# Patient Record
Sex: Female | Born: 1960
Health system: Southern US, Community
[De-identification: ages and names within clinical notes are randomized; demographics above are authoritative.]

## PROBLEM LIST (undated history)

## (undated) DIAGNOSIS — L659 Nonscarring hair loss, unspecified: Secondary | ICD-10-CM

## (undated) DIAGNOSIS — R292 Abnormal reflex: Secondary | ICD-10-CM

## (undated) DIAGNOSIS — R51 Headache: Principal | ICD-10-CM

## (undated) DIAGNOSIS — E669 Obesity, unspecified: Secondary | ICD-10-CM

## (undated) DIAGNOSIS — E559 Vitamin D deficiency, unspecified: Secondary | ICD-10-CM

## (undated) DIAGNOSIS — Z8742 Personal history of other diseases of the female genital tract: Secondary | ICD-10-CM

## (undated) DIAGNOSIS — O139 Gestational [pregnancy-induced] hypertension without significant proteinuria, unspecified trimester: Secondary | ICD-10-CM

## (undated) DIAGNOSIS — G43909 Migraine, unspecified, not intractable, without status migrainosus: Secondary | ICD-10-CM

## (undated) HISTORY — DX: Gestational (pregnancy-induced) hypertension without significant proteinuria, unspecified trimester: O13.9

## (undated) HISTORY — DX: Migraine, unspecified, not intractable, without status migrainosus: G43.909

## (undated) HISTORY — DX: Abnormal reflex: R29.2

## (undated) HISTORY — DX: Obesity, unspecified: E66.9

## (undated) HISTORY — DX: Vitamin D deficiency, unspecified: E55.9

## (undated) HISTORY — DX: Personal history of other diseases of the female genital tract: Z87.42

## (undated) HISTORY — DX: Headache: R51

## (undated) HISTORY — DX: Nonscarring hair loss, unspecified: L65.9

---

## 2002-05-17 ENCOUNTER — Emergency Department (HOSPITAL_COMMUNITY): Admission: EM | Admit: 2002-05-17 | Discharge: 2002-05-17 | Payer: Self-pay | Admitting: Emergency Medicine

## 2002-05-17 ENCOUNTER — Encounter: Payer: Self-pay | Admitting: Emergency Medicine

## 2007-06-29 ENCOUNTER — Encounter: Admission: RE | Admit: 2007-06-29 | Discharge: 2007-06-29 | Payer: Self-pay | Admitting: Family Medicine

## 2009-06-16 ENCOUNTER — Emergency Department (HOSPITAL_COMMUNITY): Admission: EM | Admit: 2009-06-16 | Discharge: 2009-06-16 | Payer: Self-pay | Admitting: Family Medicine

## 2009-06-16 ENCOUNTER — Emergency Department (HOSPITAL_COMMUNITY): Admission: EM | Admit: 2009-06-16 | Discharge: 2009-06-16 | Payer: Self-pay | Admitting: Emergency Medicine

## 2010-01-04 ENCOUNTER — Inpatient Hospital Stay (HOSPITAL_COMMUNITY): Admission: AD | Admit: 2010-01-04 | Discharge: 2010-01-05 | Payer: Self-pay | Admitting: Obstetrics and Gynecology

## 2010-01-04 ENCOUNTER — Ambulatory Visit: Payer: Self-pay | Admitting: Obstetrics and Gynecology

## 2010-02-07 ENCOUNTER — Ambulatory Visit: Payer: Self-pay | Admitting: Obstetrics & Gynecology

## 2010-02-07 ENCOUNTER — Inpatient Hospital Stay (HOSPITAL_COMMUNITY): Admission: AD | Admit: 2010-02-07 | Discharge: 2010-02-08 | Payer: Self-pay | Admitting: Family Medicine

## 2010-02-20 ENCOUNTER — Inpatient Hospital Stay (HOSPITAL_COMMUNITY): Admission: AD | Admit: 2010-02-20 | Discharge: 2010-02-20 | Payer: Self-pay | Admitting: Family Medicine

## 2010-02-20 ENCOUNTER — Ambulatory Visit: Payer: Self-pay | Admitting: Family Medicine

## 2010-02-23 ENCOUNTER — Ambulatory Visit: Payer: Self-pay | Admitting: Advanced Practice Midwife

## 2010-02-23 ENCOUNTER — Inpatient Hospital Stay (HOSPITAL_COMMUNITY): Admission: AD | Admit: 2010-02-23 | Discharge: 2010-02-25 | Payer: Self-pay | Admitting: Obstetrics & Gynecology

## 2010-02-23 ENCOUNTER — Encounter: Payer: Self-pay | Admitting: Obstetrics & Gynecology

## 2010-02-27 ENCOUNTER — Inpatient Hospital Stay (HOSPITAL_COMMUNITY): Admission: AD | Admit: 2010-02-27 | Discharge: 2010-03-03 | Payer: Self-pay | Admitting: Obstetrics & Gynecology

## 2010-03-06 ENCOUNTER — Inpatient Hospital Stay (HOSPITAL_COMMUNITY): Admission: AD | Admit: 2010-03-06 | Discharge: 2010-03-06 | Payer: Self-pay | Admitting: Obstetrics & Gynecology

## 2010-03-31 ENCOUNTER — Ambulatory Visit: Payer: Self-pay | Admitting: Obstetrics and Gynecology

## 2010-03-31 LAB — CONVERTED CEMR LAB: BUN: 13 mg/dL (ref 6–23)

## 2010-04-15 ENCOUNTER — Ambulatory Visit: Payer: Self-pay | Admitting: Obstetrics and Gynecology

## 2010-07-12 LAB — POCT URINALYSIS DIPSTICK
Bilirubin Urine: NEGATIVE
Glucose, UA: NEGATIVE mg/dL
Nitrite: NEGATIVE
pH: 7 (ref 5.0–8.0)

## 2010-07-13 LAB — COMPREHENSIVE METABOLIC PANEL
ALT: 181 U/L — ABNORMAL HIGH (ref 0–35)
AST: 66 U/L — ABNORMAL HIGH (ref 0–37)
CO2: 28 mEq/L (ref 19–32)
Chloride: 103 mEq/L (ref 96–112)
Creatinine, Ser: 0.71 mg/dL (ref 0.4–1.2)
GFR calc Af Amer: >60 mL/min (ref >60)
GFR calc non Af Amer: >60 mL/min (ref >60)
Glucose, Bld: 71 mg/dL (ref 70–99)
Total Bilirubin: 0.6 mg/dL (ref 0.3–1.2)

## 2010-07-14 LAB — URINALYSIS, ROUTINE W REFLEX MICROSCOPIC
Bilirubin Urine: NEGATIVE
Ketones, ur: NEGATIVE mg/dL
Nitrite: NEGATIVE
Specific Gravity, Urine: 1.004 — ABNORMAL LOW (ref 1.005–1.030)
Urobilinogen, UA: 0.2 mg/dL (ref 0.0–1.0)

## 2010-07-14 LAB — COMPREHENSIVE METABOLIC PANEL
ALT: 220 U/L — ABNORMAL HIGH (ref 0–35)
ALT: 299 U/L — ABNORMAL HIGH (ref 0–35)
AST: 102 U/L — ABNORMAL HIGH (ref 0–37)
AST: 262 U/L — ABNORMAL HIGH (ref 0–37)
Alkaline Phosphatase: 186 U/L — ABNORMAL HIGH (ref 39–117)
Alkaline Phosphatase: 175 U/L — ABNORMAL HIGH (ref 39–117)
BUN: 12 mg/dL (ref 6–23)
CO2: 30 mEq/L (ref 19–32)
Calcium: 9 mg/dL (ref 8.4–10.5)
Calcium: 8.7 mg/dL (ref 8.4–10.5)
Chloride: 102 mEq/L (ref 96–112)
Creatinine, Ser: 0.96 mg/dL (ref 0.4–1.2)
GFR calc Af Amer: >60 mL/min (ref >60)
GFR calc Af Amer: >60 mL/min (ref >60)
GFR calc non Af Amer: >60 mL/min (ref >60)
Glucose, Bld: 73 mg/dL (ref 70–99)
Sodium: 138 mEq/L (ref 135–145)
Sodium: 140 mEq/L (ref 135–145)
Total Bilirubin: 0.7 mg/dL (ref 0.3–1.2)
Total Protein: 6.4 g/dL (ref 6.0–8.3)
Total Protein: 6.3 g/dL (ref 6.0–8.3)

## 2010-07-14 LAB — CBC
HCT: 32.9 % — ABNORMAL LOW (ref 36.0–46.0)
Hemoglobin: 11.6 g/dL — ABNORMAL LOW (ref 12.0–15.0)
Hemoglobin: 12.3 g/dL (ref 12.0–15.0)
MCH: 28 pg (ref 26.0–34.0)
MCH: 28.8 pg (ref 26.0–34.0)
MCH: 29 pg (ref 26.0–34.0)
MCHC: 34.3 g/dL (ref 30.0–36.0)
MCHC: 33.5 g/dL (ref 30.0–36.0)
MCV: 81.6 fL (ref 78.0–100.0)
RBC: 4.04 MIL/uL (ref 3.87–5.11)
RBC: 4.24 MIL/uL (ref 3.87–5.11)
RDW: 13.6 % (ref 11.5–15.5)
RDW: 14.2 % (ref 11.5–15.5)

## 2010-07-14 LAB — RPR: RPR Ser Ql: NONREACTIVE

## 2010-07-14 LAB — POCT I-STAT, CHEM 8
BUN: 13 mg/dL (ref 6–23)
Calcium, Ion: 1.21 mmol/L (ref 1.12–1.32)
Chloride: 105 mEq/L (ref 96–112)
Glucose, Bld: 77 mg/dL (ref 70–99)
TCO2: 28 mmol/L (ref 0–100)

## 2010-07-14 LAB — DIFFERENTIAL
Basophils Relative: 1 % (ref 0–1)
Lymphocytes Relative: 24 % (ref 12–46)
Monocytes Relative: 7 % (ref 3–12)
Neutro Abs: 5.6 10*3/uL (ref 1.7–7.7)
Neutrophils Relative %: 66 % (ref 43–77)

## 2010-07-14 LAB — MRSA PCR SCREENING: MRSA by PCR: NEGATIVE

## 2010-07-15 LAB — KLEIHAUER-BETKE STAIN: Fetal Cells %: 0 %

## 2010-07-22 LAB — POCT URINALYSIS DIP (DEVICE)
Bilirubin Urine: NEGATIVE
Glucose, UA: NEGATIVE mg/dL
Ketones, ur: NEGATIVE mg/dL
pH: 5.5 (ref 5.0–8.0)

## 2010-07-22 LAB — BASIC METABOLIC PANEL
BUN: 9 mg/dL (ref 6–23)
Calcium: 9.4 mg/dL (ref 8.4–10.5)
Creatinine, Ser: 0.79 mg/dL (ref 0.4–1.2)
GFR calc non Af Amer: >60 mL/min (ref >60)
Glucose, Bld: 80 mg/dL (ref 70–99)
Sodium: 137 mEq/L (ref 135–145)

## 2010-07-22 LAB — CBC
Hemoglobin: 12.7 g/dL (ref 12.0–15.0)
MCHC: 34 g/dL (ref 30.0–36.0)
Platelets: 234 10*3/uL (ref 150–400)
RDW: 12 % (ref 11.5–15.5)

## 2010-07-22 LAB — DIFFERENTIAL
Basophils Absolute: 0 10*3/uL (ref 0.0–0.1)
Basophils Relative: 1 % (ref 0–1)
Lymphocytes Relative: 48 % — ABNORMAL HIGH (ref 12–46)
Neutro Abs: 1.5 10*3/uL — ABNORMAL LOW (ref 1.7–7.7)
Neutrophils Relative %: 37 % — ABNORMAL LOW (ref 43–77)

## 2010-07-22 LAB — GC/CHLAMYDIA PROBE AMP, GENITAL
Chlamydia, DNA Probe: NEGATIVE
GC Probe Amp, Genital: NEGATIVE

## 2010-07-22 LAB — WET PREP, GENITAL
Clue Cells Wet Prep HPF POC: NONE SEEN
Trich, Wet Prep: NONE SEEN
Yeast Wet Prep HPF POC: NONE SEEN

## 2012-03-11 IMAGING — CT CT ANGIO HEAD
2 of 7 series · 7 of 33 positions shown · IV contrast (APPLIED)
Comparison: None

CLINICAL DATA: Sudden onset severe headache.  5 weeks postpartum.

CT ANGIOGRAPHY HEAD
TECHNIQUE: Multidetector CT imaging of the head was performed
using the standard protocol during bolus administration of
intravenous contrast.  Multiplanar CT image reconstructions
including MIPs were obtained to evaluate the vascular anatomy.
Contrast:  100 ml Omnipaque 315

[Series 3: head w/o 4.8 h47s · axial · non-contrast · 0.47mm/px · z∈[+1174,+1232]mm · 2 of 36 slices shown]
[im 12/36  soft-tissue]
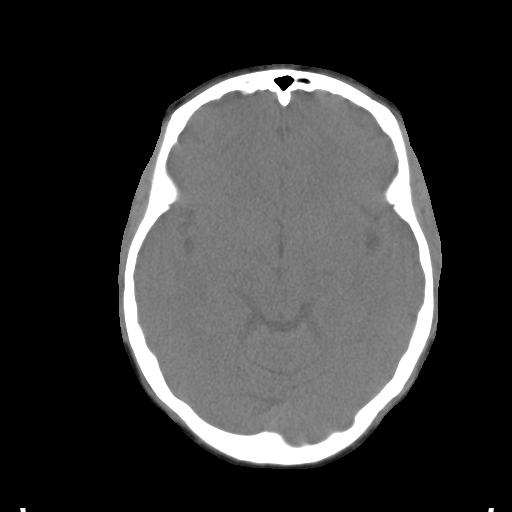
[im 24/36  soft-tissue]
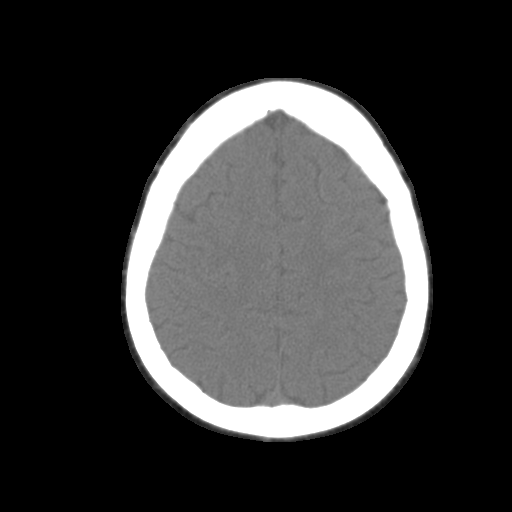

[Series 9: angio 2mm · axial · 0.44mm/px · z∈[+1162,+1270]mm · 5 of 82 slices shown]
[im 14/82  soft-tissue]
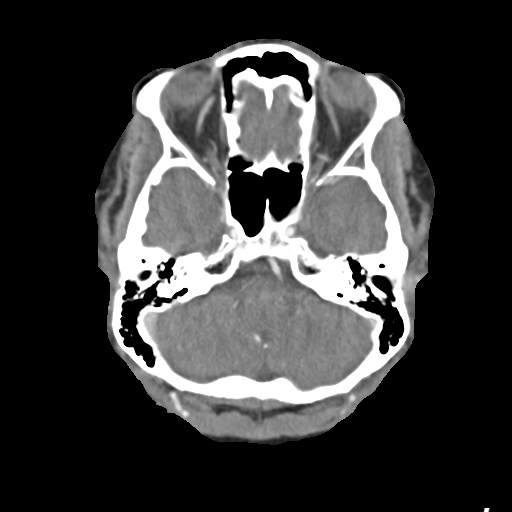
[im 28/82  bone]
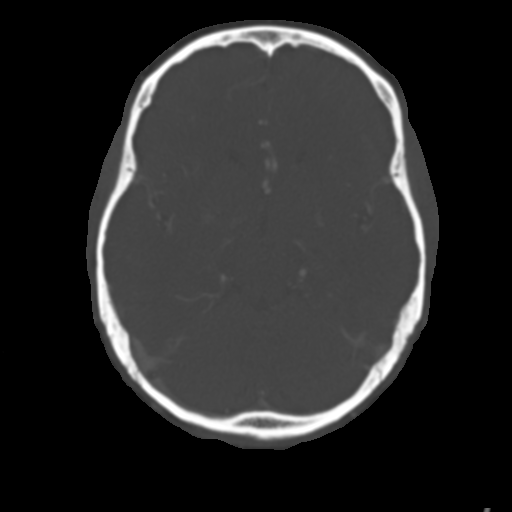
[im 41/82  soft-tissue]
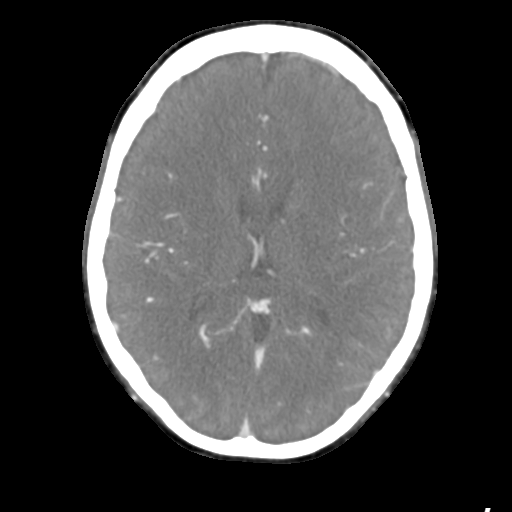
[im 55/82  bone]
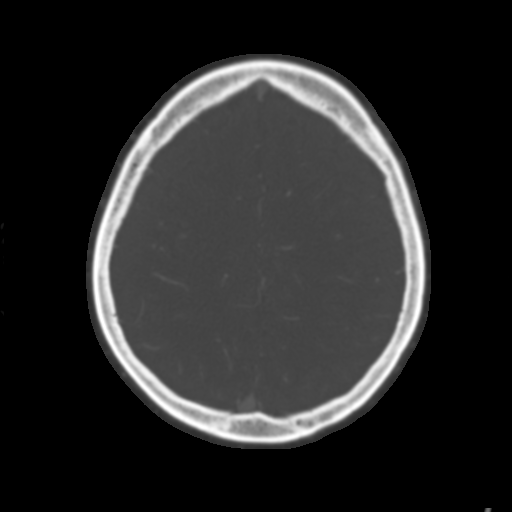
[im 68/82  soft-tissue]
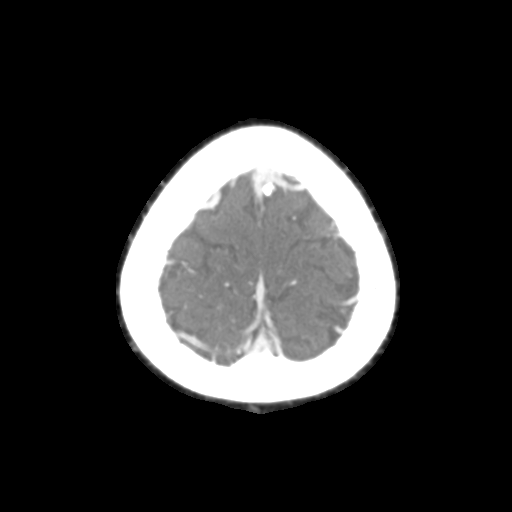

[7 of 33 positions shown; findings below may reference images not displayed]

FINDINGS: Parenchymal imaging shows normal appearance of the
brain.  No evidence of old or acute infarction, mass lesion,
hemorrhage, hydrocephalus or extra-axial collection.  The calvarium
is unremarkable.  Sinuses, middle ears and mastoids are clear.

Both internal carotid arteries are widely patent into the brain.
The anterior and middle cerebral vessels are normal without
proximal stenosis, aneurysm or vascular malformation.  Both
vertebral arteries are patent to the basilar.  No basilar stenosis.
Posterior circulation branch vessels appear normal.

Major venous structures of all appear normal.

 Review of the MIP images confirms the above findings.
IMPRESSION: Normal examination.  No evidence of brain parenchymal pathology.
No evidence of intracranial hemorrhage.  No arterial or venous
pathology evident.

## 2012-05-02 HISTORY — PX: DILATION AND CURETTAGE OF UTERUS: SHX78

## 2012-11-26 ENCOUNTER — Other Ambulatory Visit: Payer: Self-pay | Admitting: Infectious Diseases

## 2012-11-26 ENCOUNTER — Ambulatory Visit
Admission: RE | Admit: 2012-11-26 | Discharge: 2012-11-26 | Disposition: A | Discharge status: Home / Self Care | Payer: No Typology Code available for payment source | Source: Ambulatory Visit | Attending: Infectious Diseases | Admitting: Infectious Diseases

## 2012-11-26 DIAGNOSIS — R7611 Nonspecific reaction to tuberculin skin test without active tuberculosis: Secondary | ICD-10-CM

## 2012-12-26 ENCOUNTER — Other Ambulatory Visit: Payer: Self-pay | Admitting: Obstetrics and Gynecology

## 2013-07-22 ENCOUNTER — Ambulatory Visit (INDEPENDENT_AMBULATORY_CARE_PROVIDER_SITE_OTHER): Payer: BC Managed Care – PPO | Admitting: Neurology

## 2013-07-22 ENCOUNTER — Encounter (INDEPENDENT_AMBULATORY_CARE_PROVIDER_SITE_OTHER): Payer: Self-pay

## 2013-07-22 ENCOUNTER — Encounter: Payer: Self-pay | Admitting: Neurology

## 2013-07-22 VITALS — BP 97/61 | HR 79 | Ht 65.5 in | Wt 175.0 lb

## 2013-07-22 DIAGNOSIS — R519 Headache, unspecified: Secondary | ICD-10-CM | POA: Insufficient documentation

## 2013-07-22 DIAGNOSIS — R51 Headache: Secondary | ICD-10-CM

## 2013-07-22 HISTORY — DX: Headache, unspecified: R51.9

## 2013-07-22 MED ORDER — BUTALBITAL-APAP-CAFFEINE 50-325-40 MG PO TABS: 1.0000 | ORAL_TABLET | Freq: Three times a day (TID) | ORAL | Status: DC | PRN

## 2013-07-22 NOTE — Patient Instructions (Signed)
Please remember, common headache triggers are: sleep deprivation, dehydration, overheating, stress, hypoglycemia or skipping meals and blood sugar fluctuations, excessive pain medications or excessive alcohol use or caffeine withdrawal. Some people have food triggers such as aged cheese, orange juice or chocolate, especially dark chocolate, or MSG (monosodium glutamate). Try to avoid these headache triggers as much possible. It may be helpful to keep a headache diary to figure out what makes your headaches worse or brings them on and what alleviates them. Some people report headache onset after exercise but studies have shown that regular exercise may actually prevent headaches from coming. If you have exercise-induced headaches, please make sure that you drink plenty of fluid before and after exercising and that you do not over do it and do not overheat.  We will try as needed fioricet for your headache. Your headache may be related to the use of phentermine.

## 2013-07-22 NOTE — Progress Notes (Signed)
Subjective:    Patient ID: Kaitlyn Sullivan is a 53 y.o. female.  HPI    Kaitlyn FoleySaima Kahlin Mark, MD, PhD [facility] [address], [address] [address] [place], [place] [address]  Dear Dr. Renne CriglerPharr,   I saw your patient, Kaitlyn Sullivan, upon your kind request in my neurologic clinic today for initial consultation of her headaches, in particular concern for migraines. The patient is unaccompanied today. As you know, Kaitlyn Sullivan is a 53 year old (paper age is [age]) right-handed woman with an underlying medical history of obesity and vitamin D deficiency, who reports recurrent right frontal headaches since November 2014. She has a history of preeclampsia and reports that her headaches have become worse since childbirth. She had a CTA head on 02/27/10 post partum, d/t severe headaches: Normal examination. No evidence of brain parenchymal pathology. No evidence of intracranial hemorrhage. No arterial or venous  pathology evident.  She denies any associated numbness, tingling, weakness, nausea, vomiting, but does have some photophobia. Headache frequency is erratic and it may last several hours. Over-the-counter ibuprofen has helped at a dose of 600 mg. Very occasionally, she has taken hydrocodone, which helped. She describes a R fronto-temporal HA, throbbing and interestingly, she has not had her phentermine for the past 10 days, and in fact, she has not had a HA in the last 8-10. HA frequency was daily or every other day since her last child was born. She has 53 yo twins and a 53 yo and a 53 yo. 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 She endorses stress.  There is no family history of migraine.  Treatments tried include OTC meds and hydrocodone, which she had left from prior dental work and from post partum pain.  The patient denies prior TIA or stroke symptoms, such as sudden onset of one sided weakness, numbness, tingling, slurring of speech or droopy face, hearing loss, tinnitus,  diplopia or visual field cut or monocular loss of vision.  Of note, the patient denies snoring, and there is no report of witnessed apneas or choking sensations while asleep.  Her Past Medical History Is Significant For: Past Medical History  Diagnosis Date  . Obesity, unspecified   . Unspecified vitamin D deficiency   . Transient hypertension of pregnancy, unspecified as to episode of care   . Alopecia, unspecified   . Migraine, unspecified, without mention of intractable migraine without mention of status migrainosus   . Abnormal reflex   . Personal history of other genital system and obstetric disorders(V13.29)     Her Past Surgical History Is Significant For: Past Surgical History  Procedure Laterality Date  . Dilation and curettage of uterus  2014    Her Family History Is Significant For: Family History  Problem Relation Age of Onset  . Hypertension Mother     Her Social History Is Significant For: History   Social History  . Marital Status: Married    Spouse Name: N/A    Number of Children: 4  . Years of Education: college   Occupational History  .     Social History Main Topics  . Smoking status: Never Smoker   . Smokeless tobacco: None  . Alcohol Use: No  . Drug Use: No  . Sexual Activity: None   Other Topics Concern  . None   Social History Narrative   Patient lives at home with her family...and drinks tea.    Her Allergies Are:  Not on File:  Her Current Medications Are:  Outpatient Encounter Prescriptions as of 07/22/2013  Medication Sig  . cholecalciferol (VITAMIN D) 1000 UNITS tablet Take 1,000 Units by mouth daily.  . Multiple Vitamin (MULTIVITAMIN) tablet Take 1 tablet by mouth daily.  . phentermine 37.5 MG capsule Take 37.5 mg by mouth every morning.  :  Review of Systems:  Out of a complete 14 point review of systems, all are reviewed and negative with the exception of these symptoms as listed below:  Review of Systems   Constitutional: Positive for appetite change and unexpected weight change.  Eyes: Positive for visual disturbance.       Blurred vision  Gastrointestinal: Positive for constipation.  Neurological: Positive for weakness, numbness and headaches.  Hematological: Bruises/bleeds easily.       Anemia  Psychiatric/Behavioral: Positive for sleep disturbance.       Decreased Engery    Objective:  Neurologic Exam  Physical Exam Physical Examination:   Filed Vitals:   07/22/13 1310  BP: 97/61  Pulse: 79    General Examination: The patient is a very pleasant 66 female in no acute distress. She appears well-developed and well-nourished and very well groomed. She is overweight.  HEENT: Normocephalic, atraumatic, pupils are equal, round and reactive to light and accommodation. Funduscopic exam is normal with sharp disc margins noted. Extraocular tracking is good without limitation to gaze excursion or nystagmus noted. Normal smooth pursuit is noted. Hearing is grossly intact. Tympanic membranes are clear bilaterally. Face is symmetric with normal facial animation and normal facial sensation. Speech is clear with no dysarthria noted. There is no hypophonia. There is no lip, neck/head, jaw or voice tremor. Neck is supple with full range of passive and active motion. There are no carotid bruits on auscultation. Oropharynx exam reveals: mild mouth dryness, good dental hygiene and mild airway crowding, due to elongated tongue. Mallampati is class II. Tongue protrudes centrally and palate elevates symmetrically. Tonsils are 1+ in size.    Chest: Clear to auscultation without wheezing, rhonchi or crackles noted.  Heart: S1+S2+0, regular and normal without murmurs, rubs or gallops noted.   Abdomen: Soft, non-tender and non-distended with normal bowel sounds appreciated on auscultation.  Extremities: There is no pitting edema in the distal lower extremities bilaterally. Pedal pulses are intact.  Skin:  Warm and dry without trophic changes noted. There are no varicose veins.  Musculoskeletal: exam reveals no obvious joint deformities, tenderness or joint swelling or erythema.   Neurologically:  Mental status: The patient is awake, alert and oriented in all 4 spheres. Her immediate and remote memory, attention, language skills and fund of knowledge are appropriate. There is no evidence of aphasia, agnosia, apraxia or anomia. Speech is clear with normal prosody and enunciation. Thought process is linear. Mood is normal and affect is normal.  Cranial nerves II - XII are as described above under HEENT exam. In addition: shoulder shrug is normal with equal shoulder height noted. Motor exam: Normal bulk, strength and tone is noted. There is no drift, tremor or rebound. Romberg is negative. Reflexes are 2+ throughout. Babinski: Toes are flexor bilaterally. Fine motor skills and coordination: intact with normal finger taps, normal hand movements, normal rapid alternating patting, normal foot taps and normal foot agility.  Cerebellar testing: No dysmetria or intention tremor on finger to nose testing. Heel to shin is unremarkable bilaterally. There is no truncal or gait ataxia.  Sensory exam: intact to light touch, pinprick, vibration, temperature sense in the upper and lower extremities.  Gait, station and balance: She stands easily. No veering to one side is noted. No leaning to one side is noted. Posture is age-appropriate and stance is narrow based. Gait shows normal stride length and normal pace. No problems turning are noted. She turns en bloc. Tandem walk is unremarkable. Intact toe and heel stance is noted.                Assessment and Plan:   In summary, Kaitlyn Sullivan is a very pleasant 53 y.o.-year old female with an underlying medical history of with an underlying medical history of obesity and vitamin D deficiency, who reports recurrent headaches, essentially since her last child was born  some 3 years ago. She has a nonfocal exam. She does not have a classic history of migraines. Her history is suggestive of perhaps tension headache versus medication side effects from the phentermine. It is of interest that she has not taken her phentermine in the last 10 days and she has been essentially headache free for the past 8-10 days. I do not think we need to add any imaging tests at this time. She had a nonfocal CT angiogram some 3-1/2 years ago.  I had a long chat with the patient about my findings and the diagnosis of recurrent headaches, and treatment options. We talked about medical treatments and non-pharmacological approaches. We talked about maintaining a healthy lifestyle in general. I encouraged the patient to eat healthy, exercise daily and keep well hydrated, to keep a scheduled bedtime and wake time routine, to not skip any meals and eat healthy snacks in between meals and to have protein with every meal.   I advised the patient about common headache triggers: sleep deprivation, dehydration, overheating, stress, hypoglycemia or skipping meals and blood sugar fluctuations, excessive pain medications or excessive alcohol use or caffeine withdrawal. Some people have food triggers such as aged cheese, orange juice or chocolate, especially dark chocolate, or MSG (monosodium glutamate). She is to try to avoid these headache triggers as much possible. It may be helpful to keep a headache diary to figure out what makes Her headaches worse or brings them on and what alleviates them. Some people report headache onset after exercise but studies have shown that regular exercise may actually prevent headaches from coming. If She has exercise-induced headaches, She is advised to drink plenty of fluid before and after exercising and that to not overdo it and to not overheat.  As far as medications are concerned, I recommended the following at this time: She can use over-the-counter ibuprofen. For as needed  use a suggest Fioricet. I advised her not to take it daily as it can cause rebound headaches. She does not drink any caffeine to speak of. She is advised that we do not need to pursue a daily medication for headache prevention at this point.  I answered all her questions today and the patient was in agreement with the above outlined plan. I would like to see the patient back in 6 months, sooner if the need arises and encouraged her to call with any interim questions, concerns, problems or updates and refill requests.   Thank you very much for allowing me to participate in the care of this nice patient. If I can be of any further assistance to you please do not hesitate to call me at [phone].  Sincerely,   Kaitlyn Foley, MD, PhD

## 2014-01-23 ENCOUNTER — Ambulatory Visit: Payer: BC Managed Care – PPO | Admitting: Neurology

## 2014-06-05 ENCOUNTER — Ambulatory Visit: Payer: BC Managed Care – PPO | Admitting: Neurology

## 2014-07-21 ENCOUNTER — Emergency Department (HOSPITAL_COMMUNITY)
Admission: EM | Admit: 2014-07-21 | Discharge: 2014-07-21 | Disposition: A | Discharge status: Home / Self Care | Payer: 59 | Attending: Emergency Medicine | Admitting: Emergency Medicine

## 2014-07-21 ENCOUNTER — Encounter (HOSPITAL_COMMUNITY): Payer: Self-pay | Admitting: Emergency Medicine

## 2014-07-21 DIAGNOSIS — M791 Myalgia: Secondary | ICD-10-CM | POA: Insufficient documentation

## 2014-07-21 DIAGNOSIS — G43909 Migraine, unspecified, not intractable, without status migrainosus: Secondary | ICD-10-CM | POA: Diagnosis not present

## 2014-07-21 DIAGNOSIS — Z872 Personal history of diseases of the skin and subcutaneous tissue: Secondary | ICD-10-CM | POA: Diagnosis not present

## 2014-07-21 DIAGNOSIS — E669 Obesity, unspecified: Secondary | ICD-10-CM | POA: Diagnosis not present

## 2014-07-21 DIAGNOSIS — J029 Acute pharyngitis, unspecified: Secondary | ICD-10-CM | POA: Insufficient documentation

## 2014-07-21 DIAGNOSIS — Z79899 Other long term (current) drug therapy: Secondary | ICD-10-CM | POA: Diagnosis not present

## 2014-07-21 DIAGNOSIS — Z8742 Personal history of other diseases of the female genital tract: Secondary | ICD-10-CM | POA: Diagnosis not present

## 2014-07-21 LAB — CBC WITH DIFFERENTIAL/PLATELET
BASOS ABS: 0 10*3/uL (ref 0.0–0.1)
Basophils Relative: 0 % (ref 0–1)
EOS PCT: 1 % (ref 0–5)
Eosinophils Absolute: 0.1 10*3/uL (ref 0.0–0.7)
HCT: 35.1 % — ABNORMAL LOW (ref 36.0–46.0)
Hemoglobin: 12 g/dL (ref 12.0–15.0)
Lymphocytes Relative: 8 % — ABNORMAL LOW (ref 12–46)
Lymphs Abs: 1.1 10*3/uL (ref 0.7–4.0)
MCH: 27.6 pg (ref 26.0–34.0)
MCHC: 34.2 g/dL (ref 30.0–36.0)
MCV: 80.9 fL (ref 78.0–100.0)
MONO ABS: 0.8 10*3/uL (ref 0.1–1.0)
Monocytes Relative: 6 % (ref 3–12)
NEUTROS ABS: 10.9 10*3/uL — AB (ref 1.7–7.7)
NEUTROS PCT: 85 % — AB (ref 43–77)
PLATELETS: 236 10*3/uL (ref 150–400)
RBC: 4.34 MIL/uL (ref 3.87–5.11)
RDW: 12.6 % (ref 11.5–15.5)
WBC: 12.8 10*3/uL — ABNORMAL HIGH (ref 4.0–10.5)

## 2014-07-21 LAB — RAPID STREP SCREEN (MED CTR MEBANE ONLY): STREPTOCOCCUS, GROUP A SCREEN (DIRECT): NEGATIVE

## 2014-07-21 MED ORDER — HYDROCODONE-ACETAMINOPHEN 7.5-325 MG/15ML PO SOLN
10.0000 mL | Freq: Once | ORAL | Status: AC
  Administered 2014-07-21: 10 mL via ORAL
  Filled 2014-07-21: qty 15

## 2014-07-21 MED ORDER — PREDNISONE 20 MG PO TABS
60.0000 mg | ORAL_TABLET | Freq: Once | ORAL | Status: AC
  Administered 2014-07-21: 60 mg via ORAL
  Filled 2014-07-21: qty 3

## 2014-07-21 MED ORDER — HYDROCODONE-ACETAMINOPHEN 5-325 MG PO TABS: 1.0000 | ORAL_TABLET | Freq: Four times a day (QID) | ORAL | Status: DC | PRN

## 2014-07-21 NOTE — ED Notes (Signed)
Pt states she is been having sore throat, fever, difficulty swallowing since Saturday, pt when to her PCP and he started her on Azithromycin, pt states she doesn't have no relief.

## 2014-07-21 NOTE — Discharge Instructions (Signed)
Continue zithromax. Salt water gargles. Take ibuprofen for fever and pain every 6 hrs. Take norco for severe pain. Follow up with primary care doctor or urgent care if not improving or worsening.   Pharyngitis Pharyngitis is redness, pain, and swelling (inflammation) of your pharynx.  CAUSES  Pharyngitis is usually caused by infection. Most of the time, these infections are from viruses (viral) and are part of a cold. However, sometimes pharyngitis is caused by bacteria (bacterial). Pharyngitis can also be caused by allergies. Viral pharyngitis may be spread from person to person by coughing, sneezing, and personal items or utensils (cups, forks, spoons, toothbrushes). Bacterial pharyngitis may be spread from person to person by more intimate contact, such as kissing.  SIGNS AND SYMPTOMS  Symptoms of pharyngitis include:   Sore throat.   Tiredness (fatigue).   Low-grade fever.   Headache.  Joint pain and muscle aches.  Skin rashes.  Swollen lymph nodes.  Plaque-like film on throat or tonsils (often seen with bacterial pharyngitis). DIAGNOSIS  Your health care provider will ask you questions about your illness and your symptoms. Your medical history, along with a physical exam, is often all that is needed to diagnose pharyngitis. Sometimes, a rapid strep test is done. Other lab tests may also be done, depending on the suspected cause.  TREATMENT  Viral pharyngitis will usually get better in 3-4 days without the use of medicine. Bacterial pharyngitis is treated with medicines that kill germs (antibiotics).  HOME CARE INSTRUCTIONS   Drink enough water and fluids to keep your urine clear or pale yellow.   Only take over-the-counter or prescription medicines as directed by your health care provider:   If you are prescribed antibiotics, make sure you finish them even if you start to feel better.   Do not take aspirin.   Get lots of rest.   Gargle with 8 oz of salt water (  tsp of salt per 1 qt of water) as often as every 1-2 hours to soothe your throat.   Throat lozenges (if you are not at risk for choking) or sprays may be used to soothe your throat. SEEK MEDICAL CARE IF:   You have large, tender lumps in your neck.  You have a rash.  You cough up green, yellow-brown, or bloody spit. SEEK IMMEDIATE MEDICAL CARE IF:   Your neck becomes stiff.  You drool or are unable to swallow liquids.  You vomit or are unable to keep medicines or liquids down.  You have severe pain that does not go away with the use of recommended medicines.  You have trouble breathing (not caused by a stuffy nose). MAKE SURE YOU:   Understand these instructions.  Will watch your condition.  Will get help right away if you are not doing well or get worse. Document Released: 04/18/2005 Document Revised: 02/06/2013 Document Reviewed: 12/24/2012 [facility] Patient Information 2015 [employer] [place]. This information is not intended to replace advice given to you by your health care provider. Make sure you discuss any questions you have with your health care provider.

## 2014-07-21 NOTE — ED Provider Notes (Signed)
CSN: [id number]     Arrival date & time 07/21/14  0506 History   First MD Initiated Contact with Patient 07/21/14 [phone]     Chief Complaint  Patient presents with  . Sore Throat  . Headache     (Consider location/radiation/quality/duration/timing/severity/associated sxs/prior Treatment) HPI Kaitlyn Sullivan is a 54 y.o. female with hx of migraines, presents to ED with complaint of sore throat. Pt states sore throat started 2 days ago. Reports taking ibuprofen for it with no improvement. Reports associated headache, fever, body aches. Denies neck pain or stiffness. Patient states she was seen yesterday for the same and started on Z-Pak. She has had 2 tablets yesterday of the medication. She states the pain continued through the night and she was unable to sleep so she came to emergency department. Patient denies difficulty swallowing, states it does hurt to swallow and hurts to speak. States her daughter is sick with similar symptoms. She denies any nasal congestion. Denies any cough. No nausea, vomiting, diarrhea. No urinary symptoms  Past Medical History  Diagnosis Date  . Obesity, unspecified   . Unspecified vitamin D deficiency   . Transient hypertension of pregnancy, unspecified as to episode of care   . Alopecia, unspecified   . Migraine, unspecified, without mention of intractable migraine without mention of status migrainosus   . Abnormal reflex   . Personal history of other genital system and obstetric disorders(V13.29)   . Recurrent headache 07/22/2013   Past Surgical History  Procedure Laterality Date  . Dilation and curettage of uterus  2014   Family History  Problem Relation Age of Onset  . Hypertension Mother    History  Substance Use Topics  . Smoking status: Never Smoker   . Smokeless tobacco: Never Used  . Alcohol Use: No   OB History    No data available     Review of Systems  Constitutional: Positive for fever and chills.  HENT: Positive for sore  throat. Negative for congestion, trouble swallowing and voice change.   Respiratory: Negative for cough.   Musculoskeletal: Positive for myalgias.  Neurological: Positive for headaches.  All other systems reviewed and are negative.     Allergies  Review of patient's allergies indicates not on file.  Home Medications   Prior to Admission medications   Medication Sig Start Date End Date Taking? Authorizing Provider  butalbital-acetaminophen-caffeine (FIORICET, ESGIC) 50-325-40 MG per tablet Take 1 tablet by mouth 3 (three) times daily as needed for headache. 07/22/13   Huston Foley, MD  cholecalciferol (VITAMIN D) 1000 UNITS tablet Take 1,000 Units by mouth daily.    Historical Provider, MD  Multiple Vitamin (MULTIVITAMIN) tablet Take 1 tablet by mouth daily.    Historical Provider, MD  phentermine 37.5 MG capsule Take 37.5 mg by mouth every morning.    Historical Provider, MD   BP 133/78 mmHg  Pulse 103  Temp(Src) 100.5 F (38.1 C) (Oral)  Resp 20  Ht  (1.651 m)  Wt 172 lb (78.019 kg)  BMI 28.62 kg/m2  SpO2 97%  LMP 06/30/2014 Physical Exam  Constitutional: She appears well-developed and well-nourished. No distress.  HENT:  Head: Normocephalic and atraumatic.  Right Ear: Tympanic membrane, external ear and ear canal normal.  Left Ear: Tympanic membrane, external ear and ear canal normal.  Nose: Nose normal.  Mouth/Throat: Uvula is midline and mucous membranes are normal. No uvula swelling. Oropharyngeal exudate and posterior oropharyngeal erythema present. No posterior oropharyngeal edema or tonsillar abscesses.  Tonsils  mildly enlarged bilaterally, exudate present. Tonsils are equal in size. Uvula is midline.  Eyes: Conjunctivae are normal.  Neck: Neck supple.  Cardiovascular: Normal rate, regular rhythm and normal heart sounds.   Pulmonary/Chest: Effort normal and breath sounds normal. No respiratory distress. She has no wheezes. She has no rales.  Abdominal: Soft.  Bowel sounds are normal. She exhibits no distension. There is no tenderness. There is no rebound.  Musculoskeletal: She exhibits no edema.  Neurological: She is alert.  Skin: Skin is warm and dry.  Psychiatric: She has a normal mood and affect. Her behavior is normal.  Nursing note and vitals reviewed.   ED Course  Procedures (including critical care time) Labs Review Labs Reviewed  CBC WITH DIFFERENTIAL/PLATELET - Abnormal; Notable for the following:    WBC 12.8 (*)    HCT 35.1 (*)    Neutrophils Relative % 85 (*)    Neutro Abs 10.9 (*)    Lymphocytes Relative 8 (*)    All other components within normal limits  RAPID STREP SCREEN  CULTURE, GROUP A STREP    Imaging Review No results found.   EKG Interpretation None      MDM   Final diagnoses:  Pharyngitis     Patient is an emergency department with fever and sore throat. Symptoms for 2 days. Exam shows erythematous oropharynx, mildly inflamed bilateral tonsils which are equal in size with uvula midline. No evidence of peritonsillar or retropharyngeal abscess. She is able to swallow fluids in emergency department. There is no trismus. No evidence of Ludwig's angina. She was given prednisone 60 mg by mouth and Lortab elixir for pain. Strep is negative. She is already taking azithromycin, instructed to continue and take until all gone. Most likely viral pharyngitis. Otherwise symptomatic treatment with salt water gargles, ibuprofen, Tylenol. Patient requesting stronger pain medications, stated that's why she's here. We'll give 10 Norco tablets. Instructed to follow-up with her doctor, return if worsening.  Filed Vitals:   07/21/14 0600 07/21/14 0615 07/21/14 0630 07/21/14 0645  BP: 118/71 115/72 117/73 110/71  Pulse: 94 98 89 87  Temp:      TempSrc:      Resp:      Height:      Weight:      SpO2: 97% 98% 98% 97%     Jaynie Crumbleatyana Antonique Langford, PA-C 07/21/14 1545  April Palumbo, MD 07/24/14 0017

## 2014-07-24 LAB — CULTURE, GROUP A STREP: STREP A CULTURE: POSITIVE — AB

## 2014-07-25 ENCOUNTER — Telehealth (HOSPITAL_BASED_OUTPATIENT_CLINIC_OR_DEPARTMENT_OTHER): Payer: Self-pay | Admitting: Emergency Medicine

## 2014-07-25 NOTE — Progress Notes (Signed)
ED Antimicrobial Stewardship Positive Culture Follow Up   Kaitlyn Sullivan is an 54 y.o. female who presented to [facility] on 07/21/2014 with a chief complaint of  Chief Complaint  Patient presents with  . Sore Throat  . Headache    Recent Results (from the past 720 hour(s))  Rapid strep screen     Status: None   Collection Time: 07/21/14  5:38 AM  Result Value Ref Range Status   Streptococcus, Group A Screen (Direct) NEGATIVE NEGATIVE Final    Comment: (NOTE) A Rapid Antigen test may result negative if the antigen level in the sample is below the detection level of this test. The FDA has not cleared this test as a stand-alone test therefore the rapid antigen negative result has reflexed to a Group A Strep culture.   Culture, Group A Strep     Status: Abnormal   Collection Time: 07/21/14  5:38 AM  Result Value Ref Range Status   Strep A Culture Positive (A)  Final    Comment: (NOTE) Penicillin and ampicillin are drugs of choice for treatment of beta-hemolytic streptococcal infections. Susceptibility testing of penicillins and other beta-lactam agents approved by the FDA for treatment of beta-hemolytic streptococcal infections need not be performed routinely because nonsusceptible isolates are extremely rare in any beta-hemolytic streptococcus and have not been reported for Streptococcus pyogenes (group A). (CLSI 2011) Performed At: [facility] [place] [address] [place], [place] [id number] Mila HomerHancock William F MD YN:[phone]     []  Treated with , organism resistant to prescribed antimicrobial [x]  Patient discharged originally without antimicrobial agent and treatment is now indicated Came in with sore throat. Was just started on azithromycin. Pos for strep so going to finish course of azithromycin.    ED Provider: Marlon Peliffany Greene, PA   Ulyses SouthwardMinh Daryn Hicks, PharmD Pager: [phone] Infectious Diseases Pharmacist Phone# [phone]

## 2014-09-03 ENCOUNTER — Other Ambulatory Visit (HOSPITAL_COMMUNITY): Payer: Self-pay | Admitting: Obstetrics and Gynecology

## 2014-09-04 LAB — CYTOLOGY - PAP

## 2017-08-09 ENCOUNTER — Ambulatory Visit (INDEPENDENT_AMBULATORY_CARE_PROVIDER_SITE_OTHER): Payer: Self-pay | Admitting: *Deleted

## 2017-08-09 DIAGNOSIS — I781 Nevus, non-neoplastic: Secondary | ICD-10-CM

## 2017-08-09 NOTE — Progress Notes (Signed)
X=.3% Sotradecol administered with a 27g butterfly.  Patient received a total of 12cc.  Combo of retics and blue spiders. Easy access. Tol well. Hoping for results she is pleased with since she didn't have good results at another office. Follow up in Sept.  Photos: Yes.    Compression stockings applied: Yes.

## 2017-08-10 ENCOUNTER — Encounter: Payer: Self-pay | Admitting: *Deleted

## 2017-08-17 MED FILL — PHENTERMINE 37.5 MG TABLET: 37.5 | 30 days supply | Qty: 30 | Fill #0

## 2017-09-20 MED FILL — PHENTERMINE 37.5 MG TABLET: 37.5 | 30 days supply | Qty: 30 | Fill #0

## 2017-10-23 MED FILL — PHENTERMINE 37.5 MG TABLET: 37.5 | 30 days supply | Qty: 30 | Fill #0

## 2017-10-23 MED FILL — TOPIRAMATE 50 MG TABLET: 50 | 30 days supply | Qty: 60 | Fill #0

## 2017-11-01 MED FILL — XIIDRA 5% EYE DROPS: 5 | 30 days supply | Qty: 60 | Fill #0

## 2017-12-05 MED FILL — TOPIRAMATE 50 MG TABLET: 50 | 30 days supply | Qty: 60 | Fill #0

## 2017-12-11 MED FILL — XIIDRA 5% EYE DROPS: 5 | 30 days supply | Qty: 60 | Fill #1

## 2017-12-11 MED FILL — ALPRAZolam 0.5 MG TABS: 0.5 | 30 days supply | Qty: 30 | Fill #0

## 2017-12-11 MED FILL — PHENTERMINE 37.5 MG TABLET: 37.5 | 30 days supply | Qty: 30 | Fill #0

## 2018-01-16 ENCOUNTER — Ambulatory Visit (INDEPENDENT_AMBULATORY_CARE_PROVIDER_SITE_OTHER): Payer: Self-pay | Admitting: *Deleted

## 2018-01-16 DIAGNOSIS — I781 Nevus, non-neoplastic: Secondary | ICD-10-CM

## 2018-01-16 NOTE — Progress Notes (Signed)
X=.3% Sotradecol administered with a 27g butterfly.  Patient received a total of 12cc.  Having a good result from the prior tx. Still resolving. Cleaned up what remains. Easy access. Tol well. Follow prn.  Photos: Yes.    Compression stockings applied: Yes.

## 2018-01-23 ENCOUNTER — Ambulatory Visit: Payer: Self-pay | Admitting: *Deleted

## 2018-01-23 ENCOUNTER — Ambulatory Visit: Payer: Self-pay

## 2018-01-24 ENCOUNTER — Ambulatory Visit: Payer: Self-pay

## 2018-01-29 MED FILL — PHENTERMINE 37.5 MG TABLET: 37.5 | 30 days supply | Qty: 30 | Fill #0

## 2018-02-12 MED FILL — AMPICILLIN TR 500 MG CAP: 500 | 5 days supply | Qty: 15 | Fill #0

## 2018-02-14 MED FILL — XIIDRA 5% EYE DROPS: 5 | 30 days supply | Qty: 60 | Fill #2

## 2018-03-20 MED FILL — ALPRAZolam 0.5 MG TABS: 0.5 | 30 days supply | Qty: 30 | Fill #0

## 2018-03-20 MED FILL — PHENTERMINE 37.5 MG TABLET: 37.5 | 30 days supply | Qty: 30 | Fill #0

## 2018-05-14 MED FILL — PHENTERMINE 37.5 MG TABLET: 37.5 | 30 days supply | Qty: 30 | Fill #0

## 2018-05-18 MED FILL — ACZONE 5% GEL: 5 | 60 days supply | Qty: 60 | Fill #0

## 2018-10-16 MED FILL — CEFADROXIL 500 MG CAPSULE: 500 | 7 days supply | Qty: 14 | Fill #0

## 2018-10-16 MED FILL — predniSONE 5 MG (21) TBPK: 5 | 6 days supply | Qty: 21 | Fill #0

## 2018-10-16 MED FILL — OXYCODONE-ACETAMINOPHEN 5-3: 5-325 | 3 days supply | Qty: 30 | Fill #0

## 2018-10-23 MED FILL — OXYCODONE-ACETAMINOPHEN 5-3: 5-325 | 3 days supply | Qty: 30 | Fill #0

## 2018-10-29 MED FILL — OXYCODONE-ACETAMINOPHEN 5-3: 5-325 | 3 days supply | Qty: 30 | Fill #0

## 2018-11-07 MED FILL — traMADol HCL 50 MG TABS: 50 | 4 days supply | Qty: 30 | Fill #0

## 2018-11-08 MED FILL — XIIDRA 5% EYE DROPS: 5 | 30 days supply | Qty: 60 | Fill #0

## 2018-12-18 MED FILL — AMOX-CLAV 875-125 MG TABLET: 875-125 | 7 days supply | Qty: 14 | Fill #0

## 2019-05-07 ENCOUNTER — Telehealth: Payer: Self-pay | Admitting: Unknown Physician Specialty

## 2019-05-07 NOTE — Telephone Encounter (Signed)
Discussed with patient about Covid symptoms and the use of bamlanivimab, a monoclonal antibody infusion for those with mild to moderate Covid symptoms and at a high risk of hospitalization.  Pt is not qualified for this infusion as she does not meet the FDA criteria for emergency use of the medication.    Quarantine information given.

## 2019-06-19 MED FILL — ALREX 0.2% EYE DROPS: 0.2 | 25 days supply | Qty: 5 | Fill #0

## 2019-07-24 MED FILL — OXYCODONE-ACETAMINOPHEN 5-3: 5-325 | 5 days supply | Qty: 30 | Fill #0

## 2019-07-24 MED FILL — CEPHALEXIN 500 MG CAPSULE: 500 | 5 days supply | Qty: 20 | Fill #0

## 2019-07-24 MED FILL — predniSONE 5 MG (21) TBPK: 5 | 6 days supply | Qty: 21 | Fill #0

## 2019-10-29 ENCOUNTER — Encounter (HOSPITAL_COMMUNITY): Payer: Self-pay

## 2019-10-29 ENCOUNTER — Emergency Department (HOSPITAL_COMMUNITY)
Admission: EM | Admit: 2019-10-29 | Discharge: 2019-10-30 | Disposition: A | Discharge status: Home / Self Care | Payer: 59 | Attending: Emergency Medicine | Admitting: Emergency Medicine

## 2019-10-29 DIAGNOSIS — R1033 Periumbilical pain: Secondary | ICD-10-CM | POA: Diagnosis present

## 2019-10-29 LAB — URINALYSIS, ROUTINE W REFLEX MICROSCOPIC
Bilirubin Urine: NEGATIVE
Glucose, UA: NEGATIVE mg/dL
Hgb urine dipstick: NEGATIVE
Ketones, ur: NEGATIVE mg/dL
Leukocytes,Ua: NEGATIVE
Nitrite: NEGATIVE
Protein, ur: NEGATIVE mg/dL
Specific Gravity, Urine: 1.01 (ref 1.005–1.030)
pH: 5 (ref 5.0–8.0)

## 2019-10-29 LAB — COMPREHENSIVE METABOLIC PANEL
ALT: 14 U/L (ref 0–44)
AST: 20 U/L (ref 15–41)
Albumin: 3.7 g/dL (ref 3.5–5.0)
Alkaline Phosphatase: 64 U/L (ref 38–126)
Anion gap: 8 (ref 5–15)
BUN: 13 mg/dL (ref 6–20)
CO2: 26 mmol/L (ref 22–32)
Calcium: 9.1 mg/dL (ref 8.9–10.3)
Chloride: 104 mmol/L (ref 98–111)
Creatinine, Ser: 0.99 mg/dL (ref 0.44–1.00)
GFR calc Af Amer: >60 mL/min (ref >60)
GFR calc non Af Amer: >60 mL/min (ref >60)
Glucose, Bld: 105 mg/dL — ABNORMAL HIGH (ref 70–99)
Potassium: 3.6 mmol/L (ref 3.5–5.1)
Sodium: 138 mmol/L (ref 135–145)
Total Bilirubin: 0.5 mg/dL (ref 0.3–1.2)
Total Protein: 7.6 g/dL (ref 6.5–8.1)

## 2019-10-29 LAB — CBC
HCT: 35.2 % — ABNORMAL LOW (ref 36.0–46.0)
Hemoglobin: 11.5 g/dL — ABNORMAL LOW (ref 12.0–15.0)
MCH: 26.8 pg (ref 26.0–34.0)
MCHC: 32.7 g/dL (ref 30.0–36.0)
MCV: 82.1 fL (ref 80.0–100.0)
Platelets: 334 10*3/uL (ref 150–400)
RBC: 4.29 MIL/uL (ref 3.87–5.11)
RDW: 12.2 % (ref 11.5–15.5)
WBC: 4.4 10*3/uL (ref 4.0–10.5)
nRBC: 0 % (ref 0.0–0.2)

## 2019-10-29 LAB — LIPASE, BLOOD: Lipase: 26 U/L (ref 11–51)

## 2019-10-29 LAB — I-STAT BETA HCG BLOOD, ED (MC, WL, AP ONLY): I-stat hCG, quantitative: <5 m[IU]/mL (ref <5)

## 2019-10-29 MED ORDER — SODIUM CHLORIDE 0.9% FLUSH: 3.0000 mL | Freq: Once | INTRAVENOUS | Status: DC

## 2019-10-29 NOTE — ED Triage Notes (Signed)
Pt states that she has been having generalized abd pain for the past 2 days, denies n/v/d. Denies dysuria or abnormal discharge.

## 2019-10-30 ENCOUNTER — Emergency Department (HOSPITAL_COMMUNITY): Payer: 59

## 2019-10-30 MED ORDER — IOHEXOL 300 MG/ML  SOLN
100.0000 mL | Freq: Once | INTRAMUSCULAR | Status: AC | PRN
  Administered 2019-10-30: 100 mL via INTRAVENOUS

## 2019-10-30 MED ORDER — NAPROXEN 500 MG PO TABS: 500.0000 mg | ORAL_TABLET | Freq: Two times a day (BID) | ORAL | 0 refills | Status: DC

## 2019-10-30 NOTE — ED Notes (Signed)
Pt called for VS x2, no response °

## 2019-10-30 NOTE — ED Provider Notes (Signed)
[facility] EMERGENCY DEPARTMENT Provider Note   CSN: [id number] Arrival date & time: 10/29/19  1954     History Chief Complaint  Patient presents with  . Abdominal Pain    Kaitlyn Sullivan is a 59 y.o. female.  HPI     This is a 59 year old female with a history of migraines, obesity who presents with abdominal pain.  Patient reports that she had a tummy tuck 1 year ago.  She had a revision in March.  Since that time she has had some intermittent abdominal soreness.  She states however, over the last 3 days she has had pain that has been more constant.  She states that it is at her bellybutton and sharp in nature.  It is nonradiating.  She has taken ibuprofen and using her belly binder which does seem to help.  She currently is pain-free.  She reports baseline soreness which she has had since her tummy tuck.  She does not follow-up with her surgeon for 1 month.  She denies nausea, vomiting, constipation, diarrhea, fever, urinary symptoms.  Past Medical History:  Diagnosis Date  . Abnormal reflex   . Alopecia, unspecified   . Migraine, unspecified, without mention of intractable migraine without mention of status migrainosus   . Obesity, unspecified   . Personal history of other genital system and obstetric disorders(V13.29)   . Recurrent headache 07/22/2013  . Transient hypertension of pregnancy, unspecified as to episode of care   . Unspecified vitamin D deficiency     Patient Active Problem List   Diagnosis Date Noted  . Recurrent headache 07/22/2013    Past Surgical History:  Procedure Laterality Date  . DILATION AND CURETTAGE OF UTERUS  2014     OB History   No obstetric history on file.     Family History  Problem Relation Age of Onset  . Hypertension Mother     Social History   Tobacco Use  . Smoking status: Never Smoker  . Smokeless tobacco: Never Used  Substance Use Topics  . Alcohol use: No  . Drug use: No    Home  Medications Prior to Admission medications   Medication Sig Start Date End Date Taking? Authorizing Provider  butalbital-acetaminophen-caffeine (FIORICET, ESGIC) 50-325-40 MG per tablet Take 1 tablet by mouth 3 (three) times daily as needed for headache. Patient not taking: Reported on 07/21/2014 07/22/13   Huston Foley, MD  cholecalciferol (VITAMIN D) 1000 UNITS tablet Take 1,000 Units by mouth daily.    [provider]  HYDROcodone-acetaminophen (NORCO) 5-325 MG per tablet Take 1 tablet by mouth every 6 (six) hours as needed for moderate pain. 07/21/14   Kirichenko, Tatyana, PA-C  ibuprofen (ADVIL,MOTRIN) 200 MG tablet Take 600-800 mg by mouth every 6 (six) hours as needed for headache.    [provider]  Multiple Vitamin (MULTIVITAMIN) tablet Take 1 tablet by mouth daily.    [provider]  phentermine 37.5 MG capsule Take 37.5 mg by mouth every morning.    [provider]    Allergies    Patient has no known allergies.  Review of Systems   Review of Systems  Constitutional: Negative for fever.  Respiratory: Negative for shortness of breath.   Cardiovascular: Negative for chest pain.  Gastrointestinal: Positive for abdominal pain. Negative for diarrhea, nausea and vomiting.  Genitourinary: Negative for dysuria.  Musculoskeletal: Negative for back pain.  All other systems reviewed and are negative.   Physical Exam Updated Vital Signs BP  123/86   Pulse 65   Temp 98 F (36.7 C)   Resp 16   LMP 10/22/2019   SpO2 100%   Physical Exam Vitals and nursing note reviewed.  Constitutional:      Appearance: She is well-developed. She is not ill-appearing.  HENT:     Head: Normocephalic and atraumatic.     Mouth/Throat:     Mouth: Mucous membranes are moist.  Eyes:     Pupils: Pupils are equal, round, and reactive to light.  Cardiovascular:     Rate and Rhythm: Normal rate and regular rhythm.     Heart sounds: Normal heart sounds.  Pulmonary:       Effort: Pulmonary effort is normal. No respiratory distress.     Breath sounds: No wheezing.  Abdominal:     General: Bowel sounds are normal.     Palpations: Abdomen is soft.     Tenderness: There is no abdominal tenderness. There is no guarding or rebound.     Comments: Well-healed lower abdominal scar  Musculoskeletal:     Cervical back: Neck supple.  Skin:    General: Skin is warm and dry.  Neurological:     Mental Status: She is alert and oriented to person, place, and time.  Psychiatric:        Mood and Affect: Mood normal.     ED Results / Procedures / Treatments   Labs (all labs ordered are listed, but only abnormal results are displayed) Labs Reviewed  COMPREHENSIVE METABOLIC PANEL - Abnormal; Notable for the following components:      Result Value   Glucose, Bld 105 (*)    All other components within normal limits  CBC - Abnormal; Notable for the following components:   Hemoglobin 11.5 (*)    HCT 35.2 (*)    All other components within normal limits  LIPASE, BLOOD  URINALYSIS, ROUTINE W REFLEX MICROSCOPIC  I-STAT BETA HCG BLOOD, ED ([facility], [facility], [facility] ONLY)    EKG None  Radiology No results found.  Procedures Procedures (including critical care time)  Medications Ordered in ED Medications  sodium chloride flush (NS) 0.9 % injection 3 mL (has no administration in time range)    ED Course  I have reviewed the triage vital signs and the nursing notes.  Pertinent labs & imaging results that were available during my care of the patient were reviewed by me and considered in my medical decision making (see chart for details).    MDM Rules/Calculators/A&P                           Patient presents with abdominal pain.  Overall nontoxic and vital signs are reassuring.  Lab work reviewed from triage.  No leukocytosis.  Normal lipase.  Negative beta-hCG.  No significant metabolic or liver function derangements.  Urinalysis without evidence of UTI.  Patient's  abdominal exam is benign.  She has improvement with NSAIDs and belly binder.  Suggest that this may be muscular and ongoing chronic inflammatory pain related to her tummy tuck.  I discussed with her that I had low suspicion at this time for intra-abdominal pathology such as SBO, appendicitis, cholecystitis.  No obvious urinary source.  Patient is adamant that "something is wrong with me."  I discussed with her that I felt that CT imaging would likely not be contributory.  However, patient states "there has to be something wrong."  She is requesting further evaluation.  She  understands the risk and benefits of CT imaging.  CT obtained.  Patient signed out to oncoming provider.  Final Clinical Impression(s) / ED Diagnoses Final diagnoses:  Periumbilical abdominal pain    Rx / DC Orders ED Discharge Orders    None       Shon Baton, MD 10/30/19 [phone]

## 2019-10-30 NOTE — Discharge Instructions (Addendum)
Follow-up with your surgeon for the abdominal pain.  Also follow-up with your gynecologist for the displaced clip from your fallopian tube. Assume you could get pregnant.

## 2019-11-19 MED FILL — CEPHALEXIN 500 MG CAPSULE: 500 | 7 days supply | Qty: 28 | Fill #0

## 2019-12-18 MED FILL — PHENTERMINE 37.5 MG TABLET: 37.5 | 30 days supply | Qty: 30 | Fill #0

## 2019-12-28 MED FILL — PHENTERMINE 37.5 MG TABLET: 37.5 | 30 days supply | Qty: 30 | Fill #0

## 2020-01-27 MED FILL — ALPRAZolam 0.5 MG TABS: 0.5 | 8 days supply | Qty: 8 | Fill #0

## 2020-02-07 MED FILL — ALPRAZolam 0.5 MG TABS: 0.5 | 8 days supply | Qty: 8 | Fill #0

## 2020-02-10 ENCOUNTER — Other Ambulatory Visit (HOSPITAL_COMMUNITY): Payer: Self-pay | Admitting: Internal Medicine

## 2020-02-10 MED FILL — PHENTERMINE 37.5 MG TABLET: 37.5 | 30 days supply | Qty: 30 | Fill #0

## 2020-03-06 ENCOUNTER — Other Ambulatory Visit (HOSPITAL_COMMUNITY): Payer: Self-pay | Admitting: Internal Medicine

## 2020-03-06 MED FILL — PHENTERMINE 37.5 MG CAPSULE: 37.5 | 30 days supply | Qty: 30 | Fill #0

## 2020-03-18 MED FILL — PHENTERMINE 37.5 MG CAPSULE: 37.5 | 30 days supply | Qty: 30 | Fill #0

## 2020-03-19 ENCOUNTER — Other Ambulatory Visit (HOSPITAL_COMMUNITY): Payer: Self-pay | Admitting: Optometry

## 2020-04-03 MED FILL — XIIDRA 5 % SOLN: 5 | 30 days supply | Qty: 60 | Fill #0

## 2020-05-19 ENCOUNTER — Other Ambulatory Visit (HOSPITAL_COMMUNITY): Payer: Self-pay | Admitting: Optometry

## 2020-05-19 MED FILL — LOTEMAX SM 0.38 % GEL: 0.38 | 25 days supply | Qty: 5 | Fill #0

## 2020-05-29 ENCOUNTER — Other Ambulatory Visit (HOSPITAL_COMMUNITY): Payer: Self-pay | Admitting: Plastic Surgery

## 2020-05-29 MED FILL — CEPHALEXIN 500 MG CAPSULE: 500 | 7 days supply | Qty: 28 | Fill #0

## 2020-06-29 ENCOUNTER — Other Ambulatory Visit (HOSPITAL_COMMUNITY): Payer: Self-pay | Admitting: Plastic Surgery

## 2020-06-29 MED FILL — CEPHALEXIN 500 MG CAPSULE: 500 | 10 days supply | Qty: 40 | Fill #0

## 2020-06-29 MED FILL — PROMETHAZINE 25 MG TABLET: 25 | 5 days supply | Qty: 15 | Fill #0

## 2020-06-29 MED FILL — OXYCODONE-APAP 5-325MG: 5-325 | 7 days supply | Qty: 35 | Fill #0

## 2020-06-29 MED FILL — ALPRAZolam 0.5 MG TABS: 0.5 | 10 days supply | Qty: 30 | Fill #0

## 2020-09-03 ENCOUNTER — Other Ambulatory Visit (HOSPITAL_COMMUNITY): Payer: Self-pay

## 2020-09-03 MED ORDER — PHENTERMINE HCL 37.5 MG PO CAPS
ORAL_CAPSULE | ORAL | 0 refills | Status: DC
  Filled 2020-09-03: qty 30, 30d supply, fill #0

## 2020-09-03 MED ORDER — PHENTERMINE HCL 37.5 MG PO TABS
ORAL_TABLET | ORAL | 0 refills | Status: DC
  Filled 2020-09-03: qty 30, 30d supply, fill #0

## 2021-03-16 ENCOUNTER — Other Ambulatory Visit (HOSPITAL_COMMUNITY): Payer: Self-pay

## 2021-03-16 MED ORDER — XIIDRA 5 % OP SOLN
1.0000 [drp] | Freq: Two times a day (BID) | OPHTHALMIC | 4 refills | Status: AC
  Filled 2021-03-16: qty 180, 90d supply, fill #0

## 2021-04-07 ENCOUNTER — Other Ambulatory Visit (HOSPITAL_COMMUNITY): Payer: Self-pay

## 2021-04-07 MED ORDER — ALPRAZOLAM 0.5 MG PO TABS
0.5000 mg | ORAL_TABLET | Freq: Every day | ORAL | 0 refills | Status: DC | PRN
  Filled 2021-04-07: qty 5, 5d supply, fill #0

## 2021-04-07 MED ORDER — MEFLOQUINE HCL 250 MG PO TABS
250.0000 mg | ORAL_TABLET | ORAL | 0 refills | Status: DC
  Filled 2021-04-07: qty 8, 56d supply, fill #0

## 2021-04-08 ENCOUNTER — Other Ambulatory Visit (HOSPITAL_COMMUNITY): Payer: Self-pay

## 2021-04-09 ENCOUNTER — Other Ambulatory Visit (HOSPITAL_COMMUNITY): Payer: Self-pay

## 2021-05-31 ENCOUNTER — Other Ambulatory Visit (HOSPITAL_COMMUNITY): Payer: Self-pay

## 2021-05-31 MED ORDER — PHENTERMINE HCL 37.5 MG PO TABS
ORAL_TABLET | ORAL | 0 refills | Status: DC
  Filled 2021-05-31 – 2021-06-14 (×2): qty 30, 30d supply, fill #0

## 2021-06-09 ENCOUNTER — Other Ambulatory Visit (HOSPITAL_COMMUNITY): Payer: Self-pay

## 2021-06-14 ENCOUNTER — Other Ambulatory Visit (HOSPITAL_COMMUNITY): Payer: Self-pay

## 2021-06-15 ENCOUNTER — Other Ambulatory Visit (HOSPITAL_COMMUNITY): Payer: Self-pay

## 2021-08-11 ENCOUNTER — Other Ambulatory Visit (HOSPITAL_COMMUNITY): Payer: Self-pay

## 2021-08-11 MED ORDER — TRETINOIN 0.1 % EX CREA
TOPICAL_CREAM | CUTANEOUS | 1 refills | Status: DC
  Filled 2021-08-12: qty 45, 30d supply, fill #0

## 2021-08-11 MED ORDER — WEGOVY 0.25 MG/0.5ML ~~LOC~~ SOAJ
SUBCUTANEOUS | 0 refills | Status: DC
  Filled 2021-08-12 – 2021-08-16 (×3): qty 2, 28d supply, fill #0

## 2021-08-11 MED ORDER — WEGOVY 0.25 MG/0.5ML ~~LOC~~ SOAJ: SUBCUTANEOUS | 0 refills | Status: DC

## 2021-08-11 MED ORDER — DAPSONE 5 % EX GEL
CUTANEOUS | 1 refills | Status: DC
  Filled 2021-08-11 – 2021-10-02 (×4): qty 90, 30d supply, fill #0

## 2021-08-11 MED ORDER — ALPRAZOLAM 0.5 MG PO TABS
ORAL_TABLET | ORAL | 0 refills | Status: DC
  Filled 2021-08-11 – 2021-08-12 (×2): qty 5, 5d supply, fill #0

## 2021-08-12 ENCOUNTER — Other Ambulatory Visit (HOSPITAL_COMMUNITY): Payer: Self-pay

## 2021-08-16 ENCOUNTER — Other Ambulatory Visit (HOSPITAL_COMMUNITY): Payer: Self-pay

## 2021-08-19 ENCOUNTER — Other Ambulatory Visit (HOSPITAL_COMMUNITY): Payer: Self-pay

## 2021-08-25 ENCOUNTER — Other Ambulatory Visit (HOSPITAL_COMMUNITY): Payer: Self-pay

## 2021-08-26 ENCOUNTER — Other Ambulatory Visit (HOSPITAL_COMMUNITY): Payer: Self-pay

## 2021-08-31 ENCOUNTER — Other Ambulatory Visit (HOSPITAL_COMMUNITY): Payer: Self-pay

## 2021-08-31 MED ORDER — OXYCODONE-ACETAMINOPHEN 5-325 MG PO TABS
ORAL_TABLET | ORAL | 0 refills | Status: DC
  Filled 2021-08-31: qty 30, 4d supply, fill #0

## 2021-08-31 MED ORDER — CEPHALEXIN 500 MG PO CAPS
ORAL_CAPSULE | ORAL | 0 refills | Status: DC
  Filled 2021-08-31: qty 20, 5d supply, fill #0

## 2021-09-01 ENCOUNTER — Other Ambulatory Visit (HOSPITAL_COMMUNITY): Payer: Self-pay

## 2021-09-01 MED ORDER — TRAMADOL HCL 50 MG PO TABS
50.0000 mg | ORAL_TABLET | ORAL | 0 refills | Status: DC | PRN
  Filled 2021-09-01: qty 40, 5d supply, fill #0

## 2021-09-03 ENCOUNTER — Other Ambulatory Visit (HOSPITAL_COMMUNITY): Payer: Self-pay

## 2021-09-07 ENCOUNTER — Other Ambulatory Visit (HOSPITAL_COMMUNITY): Payer: Self-pay

## 2021-09-07 MED ORDER — ALPRAZOLAM 1 MG PO TABS
ORAL_TABLET | ORAL | 0 refills | Status: DC
  Filled 2021-09-07: qty 8, 2d supply, fill #0

## 2021-09-07 MED ORDER — TRAMADOL HCL 50 MG PO TABS
ORAL_TABLET | ORAL | 0 refills | Status: DC
  Filled 2021-09-07: qty 40, 10d supply, fill #0

## 2021-09-08 ENCOUNTER — Other Ambulatory Visit (HOSPITAL_COMMUNITY): Payer: Self-pay

## 2021-09-08 MED ORDER — CONTRAVE 8-90 MG PO TB12
ORAL_TABLET | ORAL | 0 refills | Status: DC
  Filled 2021-09-08: qty 70, 28d supply, fill #0

## 2021-09-09 ENCOUNTER — Other Ambulatory Visit (HOSPITAL_COMMUNITY): Payer: Self-pay

## 2021-09-30 ENCOUNTER — Other Ambulatory Visit (HOSPITAL_COMMUNITY): Payer: Self-pay

## 2021-09-30 MED ORDER — HYDROCORTISONE (PERIANAL) 2.5 % EX CREA
TOPICAL_CREAM | CUTANEOUS | 1 refills | Status: DC
  Filled 2021-09-30: qty 30, 15d supply, fill #0

## 2021-09-30 MED ORDER — SENNOSIDES 8.6 MG PO TABS
ORAL_TABLET | ORAL | 1 refills | Status: DC
  Filled 2021-09-30: qty 30, 15d supply, fill #0

## 2021-10-01 ENCOUNTER — Other Ambulatory Visit (HOSPITAL_COMMUNITY): Payer: Self-pay

## 2021-10-02 ENCOUNTER — Other Ambulatory Visit (HOSPITAL_COMMUNITY): Payer: Self-pay

## 2021-10-04 ENCOUNTER — Other Ambulatory Visit (HOSPITAL_COMMUNITY): Payer: Self-pay

## 2021-10-05 ENCOUNTER — Other Ambulatory Visit (HOSPITAL_COMMUNITY): Payer: Self-pay

## 2021-10-13 ENCOUNTER — Other Ambulatory Visit (HOSPITAL_COMMUNITY): Payer: Self-pay

## 2021-11-11 IMAGING — CT CT ABD-PELV W/ CM
2 of 4 series · 17 of 46 positions shown, 19 images · IV contrast (APPLIED)
Comparison: None.

CLINICAL DATA: Acute abdominal pain, periumbilical. Three days
duration.

EXAM:
CT ABDOMEN AND PELVIS WITH CONTRAST
TECHNIQUE: Multidetector CT imaging of the abdomen and pelvis was performed
using the standard protocol following bolus administration of
intravenous contrast.
CONTRAST:  100mL OMNIPAQUE IOHEXOL 300 MG/ML  SOLN

[Series 3: abd/ pelvis 5.0 i30f 2 · axial · 0.93mm/px · z∈[+720,+1145]mm · 14 of 93 slices shown, 16 images]
[im 4/93  soft-tissue]
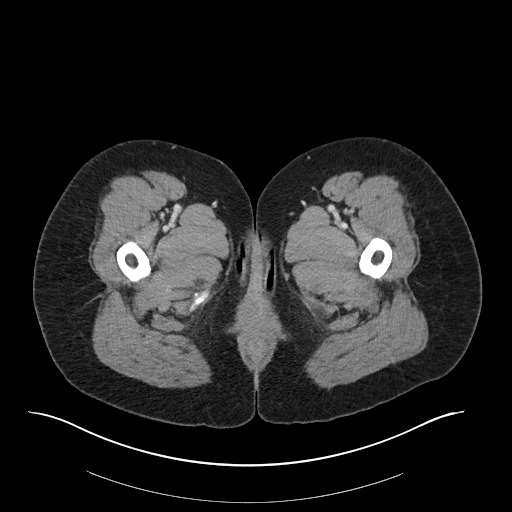
[im 4/93  bone]
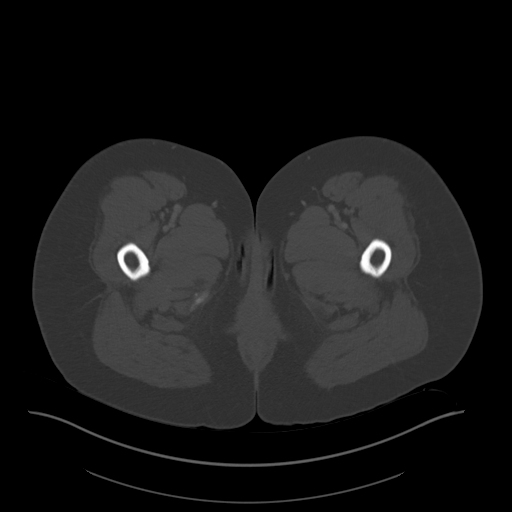
[im 12/93  soft-tissue]
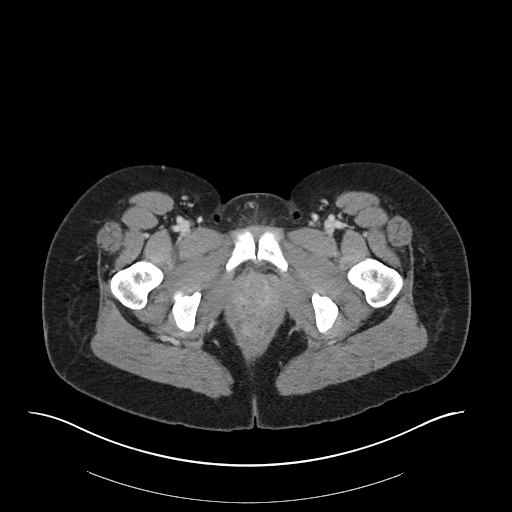
[im 20/93  soft-tissue]
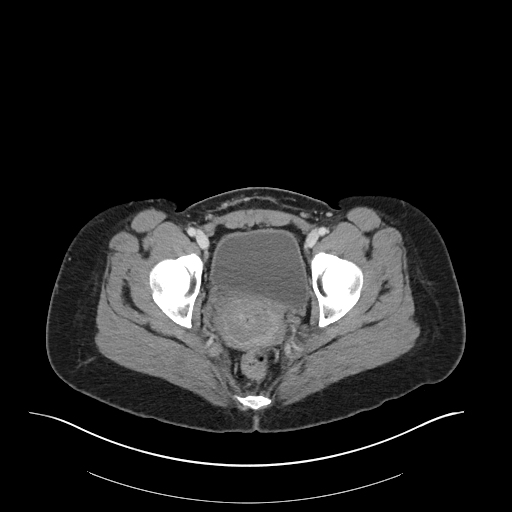
[im 24/93  soft-tissue]
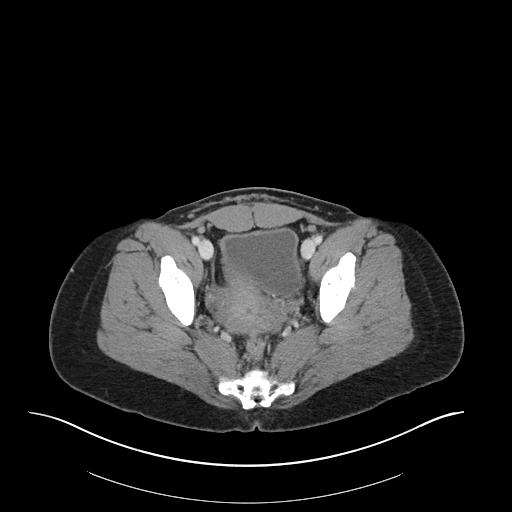
[im 31/93  soft-tissue]
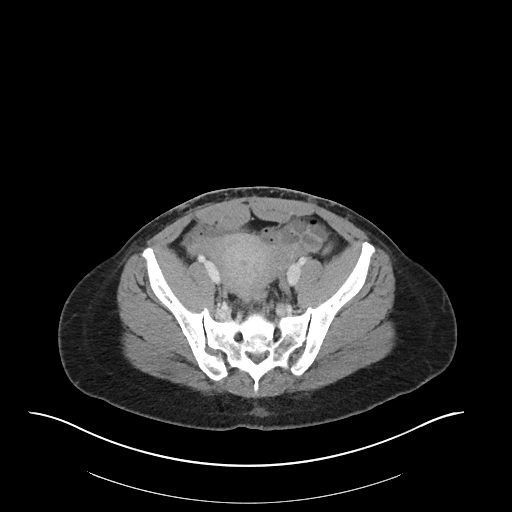
[im 39/93  soft-tissue]
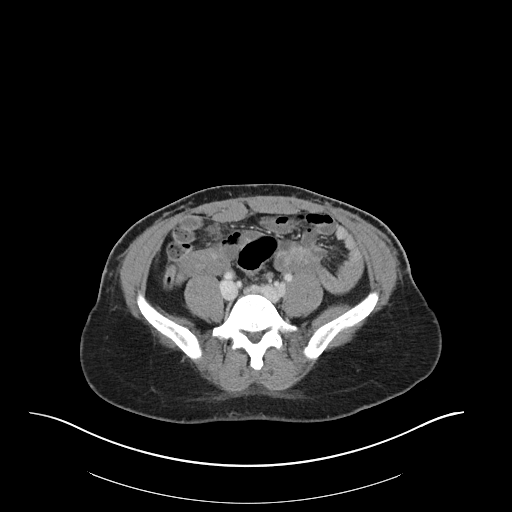
[im 43/93  soft-tissue]
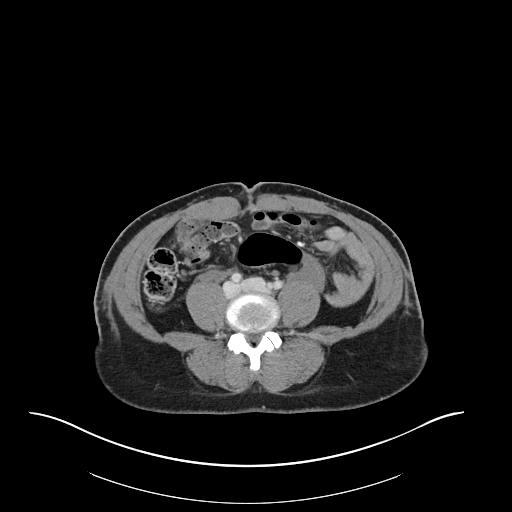
[im 50/93  soft-tissue]
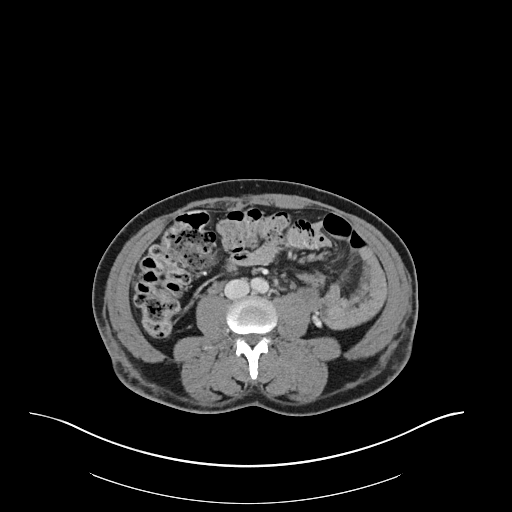
[im 54/93  soft-tissue]
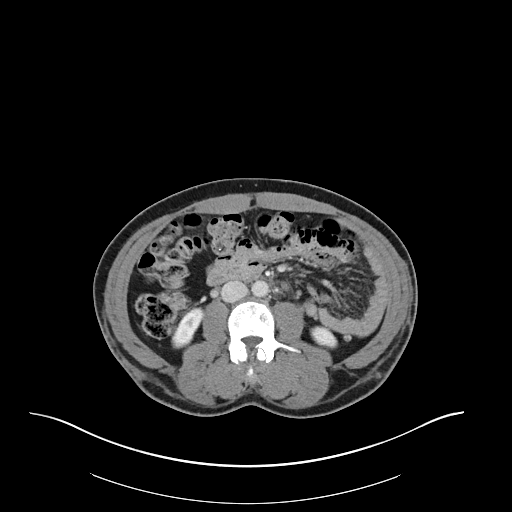
[im 54/93  bone]
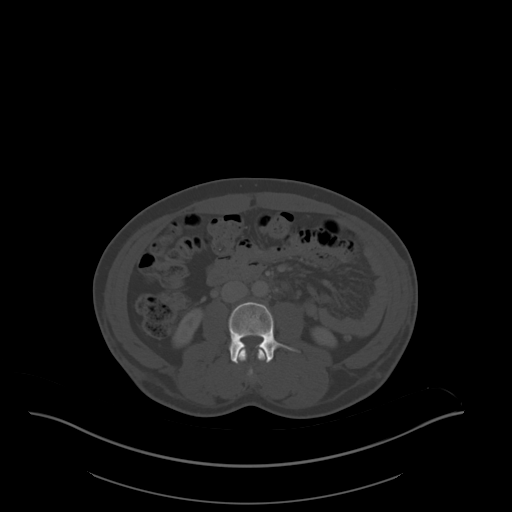
[im 62/93  soft-tissue]
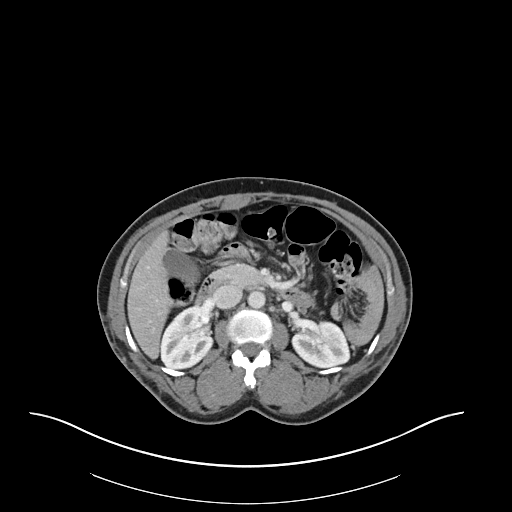
[im 70/93  soft-tissue]
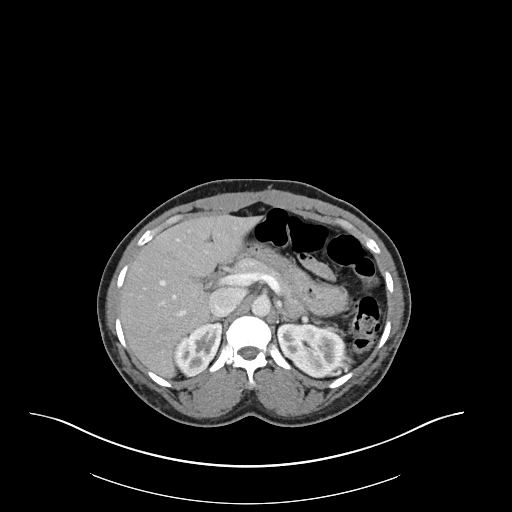
[im 73/93  soft-tissue]
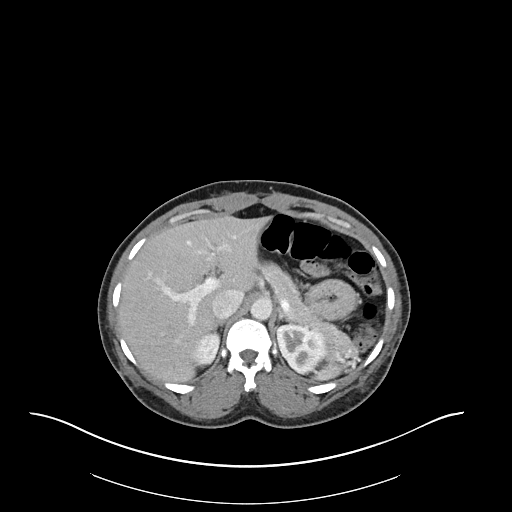
[im 81/93  soft-tissue]
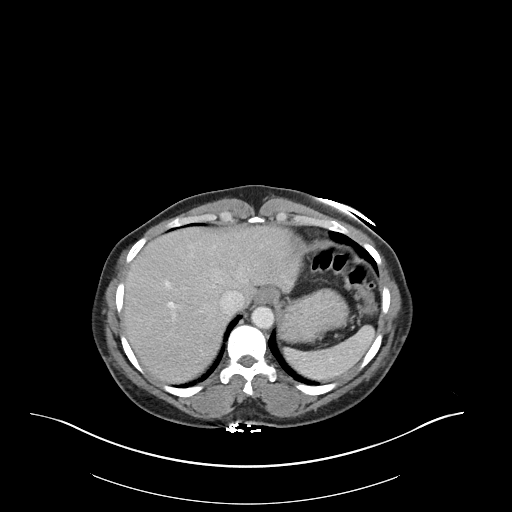
[im 89/93  soft-tissue]
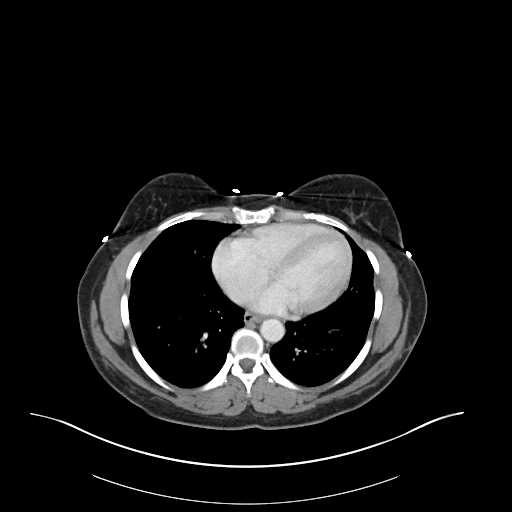

[Series 6: coronal soft tissue · coronal · 0.83mm/px · 3 of 101 slices shown]
[im 34/101  soft-tissue]
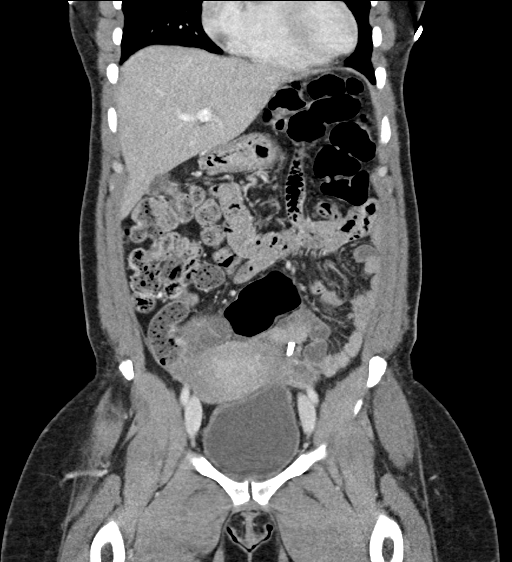
[im 45/101  soft-tissue]
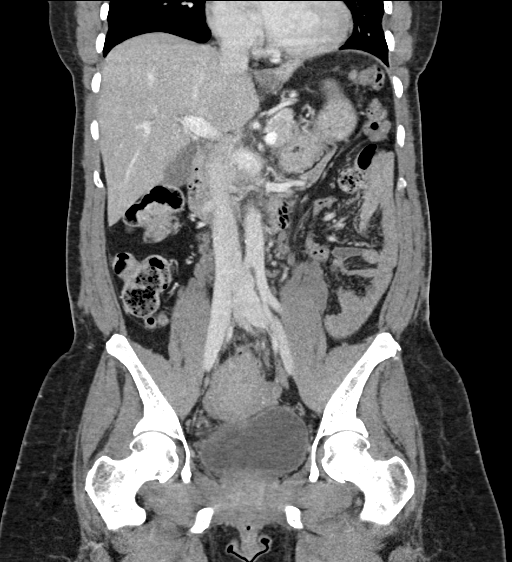
[im 56/101  soft-tissue]
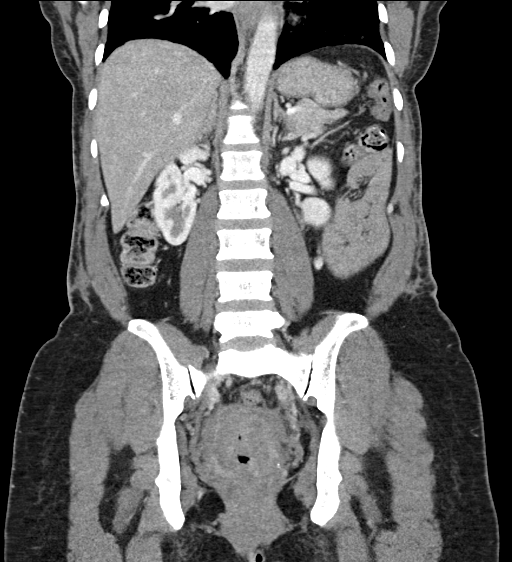

[17 of 46 positions shown; findings below may reference images not displayed]

FINDINGS: Lower chest: Normal

Hepatobiliary: Normal.  No calcified gallstones.

Pancreas: Normal

Spleen: Normal

Adrenals/Urinary Tract: Adrenal glands are normal. Kidneys are
normal. Bladder is normal.

Stomach/Bowel: Stomach and small intestine appear normal. Appendix
is normal. Colon is normal.

Vascular/Lymphatic: Normal.  No adenopathy.

Reproductive: Uterus is normal. No ovarian lesions seen. Surgical
clip in the region of the fallopian tube on the left. Possibly
displaced right fallopian tube clip located within the left central
abdomen.

Other: No free fluid or air.

Musculoskeletal: Ordinary mild lower lumbar degenerative changes.
IMPRESSION: No acute finding seen to explain the clinical presentation. No
evidence of bowel inflammation or obstruction.

Incidental question of a displaced fallopian tube clip, absent from
the right adnexal region and present within the left mid abdomen.
Left fallopian tube clip in an expected location.

## 2022-01-06 ENCOUNTER — Other Ambulatory Visit (HOSPITAL_COMMUNITY): Payer: Self-pay

## 2022-01-06 MED ORDER — METHOCARBAMOL 500 MG PO TABS
500.0000 mg | ORAL_TABLET | Freq: Every day | ORAL | 0 refills | Status: DC
  Filled 2022-01-06: qty 10, 10d supply, fill #0

## 2022-01-06 MED ORDER — IBUPROFEN 800 MG PO TABS
800.0000 mg | ORAL_TABLET | Freq: Three times a day (TID) | ORAL | 0 refills | Status: DC | PRN
  Filled 2022-01-06: qty 20, 7d supply, fill #0

## 2022-01-13 ENCOUNTER — Other Ambulatory Visit (HOSPITAL_COMMUNITY): Payer: Self-pay

## 2022-01-13 MED ORDER — PREDNISONE 10 MG PO TABS
ORAL_TABLET | ORAL | 0 refills | Status: AC
  Filled 2022-01-13: qty 27, 10d supply, fill #0

## 2022-01-13 MED ORDER — DICLOFENAC SODIUM 1 % EX GEL
CUTANEOUS | 0 refills | Status: DC
  Filled 2022-01-13: qty 100, 10d supply, fill #0

## 2022-01-21 ENCOUNTER — Other Ambulatory Visit (HOSPITAL_COMMUNITY): Payer: Self-pay

## 2022-01-21 MED ORDER — CYCLOBENZAPRINE HCL 5 MG PO TABS
5.0000 mg | ORAL_TABLET | Freq: Three times a day (TID) | ORAL | 0 refills | Status: DC
  Filled 2022-01-21: qty 30, 10d supply, fill #0

## 2022-02-08 ENCOUNTER — Ambulatory Visit: Payer: Self-pay | Admitting: Physical Therapy

## 2022-03-09 ENCOUNTER — Other Ambulatory Visit (HOSPITAL_COMMUNITY): Payer: Self-pay

## 2022-03-09 MED ORDER — DAPSONE 5 % EX GEL
CUTANEOUS | 1 refills | Status: DC
  Filled 2022-03-09 – 2022-07-13 (×5): qty 90, 30d supply, fill #0

## 2022-03-09 MED ORDER — PHENTERMINE HCL 37.5 MG PO TABS
37.5000 mg | ORAL_TABLET | Freq: Every day | ORAL | 0 refills | Status: DC
  Filled 2022-03-09: qty 30, 30d supply, fill #0

## 2022-04-05 ENCOUNTER — Other Ambulatory Visit (HOSPITAL_COMMUNITY): Payer: Self-pay

## 2022-04-05 MED ORDER — PHENTERMINE HCL 37.5 MG PO TABS
37.5000 mg | ORAL_TABLET | Freq: Every day | ORAL | 1 refills | Status: DC
  Filled 2022-04-05: qty 30, 30d supply, fill #0

## 2022-04-08 ENCOUNTER — Other Ambulatory Visit (HOSPITAL_COMMUNITY): Payer: Self-pay

## 2022-04-08 MED ORDER — ATOVAQUONE-PROGUANIL HCL 250-100 MG PO TABS
1.0000 | ORAL_TABLET | Freq: Every day | ORAL | 0 refills | Status: DC
  Filled 2022-04-08: qty 40, 40d supply, fill #0

## 2022-04-12 ENCOUNTER — Other Ambulatory Visit (HOSPITAL_COMMUNITY): Payer: Self-pay

## 2022-04-13 ENCOUNTER — Other Ambulatory Visit (HOSPITAL_COMMUNITY): Payer: Self-pay

## 2022-04-14 ENCOUNTER — Other Ambulatory Visit (HOSPITAL_COMMUNITY): Payer: Self-pay

## 2022-04-14 MED ORDER — XIIDRA 5 % OP SOLN: 1.0000 [drp] | Freq: Two times a day (BID) | OPHTHALMIC | 11 refills | Status: DC

## 2022-04-14 MED ORDER — XIIDRA 5 % OP SOLN
1.0000 [drp] | Freq: Two times a day (BID) | OPHTHALMIC | 3 refills | Status: DC
  Filled 2022-04-14 (×2): qty 180, 90d supply, fill #0

## 2022-04-15 ENCOUNTER — Other Ambulatory Visit (HOSPITAL_COMMUNITY): Payer: Self-pay

## 2022-04-15 ENCOUNTER — Other Ambulatory Visit (HOSPITAL_BASED_OUTPATIENT_CLINIC_OR_DEPARTMENT_OTHER): Payer: Self-pay

## 2022-04-15 MED ORDER — CYCLOSPORINE 0.05 % OP EMUL
1.0000 [drp] | Freq: Two times a day (BID) | OPHTHALMIC | 11 refills | Status: AC
  Filled 2022-04-15: qty 60, 30d supply, fill #0

## 2022-06-24 ENCOUNTER — Other Ambulatory Visit (HOSPITAL_COMMUNITY): Payer: Self-pay

## 2022-06-24 MED ORDER — METFORMIN HCL ER 500 MG PO TB24
ORAL_TABLET | ORAL | 0 refills | Status: DC
  Filled 2022-06-24 – 2022-07-04 (×2): qty 180, 90d supply, fill #0

## 2022-06-24 MED ORDER — PHENTERMINE HCL 37.5 MG PO TABS
37.5000 mg | ORAL_TABLET | Freq: Every day | ORAL | 1 refills | Status: DC
  Filled 2022-06-24 – 2022-07-04 (×2): qty 30, 30d supply, fill #0

## 2022-07-01 ENCOUNTER — Other Ambulatory Visit (HOSPITAL_COMMUNITY): Payer: Self-pay

## 2022-07-04 ENCOUNTER — Other Ambulatory Visit (HOSPITAL_COMMUNITY): Payer: Self-pay

## 2022-07-05 ENCOUNTER — Other Ambulatory Visit (HOSPITAL_COMMUNITY): Payer: Self-pay

## 2022-07-11 ENCOUNTER — Other Ambulatory Visit: Payer: Self-pay | Admitting: Obstetrics and Gynecology

## 2022-07-11 DIAGNOSIS — R928 Other abnormal and inconclusive findings on diagnostic imaging of breast: Secondary | ICD-10-CM

## 2022-07-13 ENCOUNTER — Other Ambulatory Visit (HOSPITAL_COMMUNITY): Payer: Self-pay

## 2022-07-13 MED ORDER — FLUCONAZOLE 150 MG PO TABS
ORAL_TABLET | ORAL | 0 refills | Status: DC
  Filled 2022-07-13: qty 2, 3d supply, fill #0

## 2022-07-21 ENCOUNTER — Ambulatory Visit: Payer: Self-pay

## 2022-07-21 ENCOUNTER — Ambulatory Visit
Admission: RE | Admit: 2022-07-21 | Discharge: 2022-07-21 | Disposition: A | Discharge status: Home / Self Care | Payer: Commercial Managed Care - HMO | Source: Ambulatory Visit | Attending: Obstetrics and Gynecology | Admitting: Obstetrics and Gynecology

## 2022-07-21 DIAGNOSIS — R928 Other abnormal and inconclusive findings on diagnostic imaging of breast: Secondary | ICD-10-CM

## 2022-09-19 ENCOUNTER — Encounter (HOSPITAL_COMMUNITY): Payer: Self-pay

## 2022-09-19 ENCOUNTER — Emergency Department (HOSPITAL_COMMUNITY)
Admission: EM | Admit: 2022-09-19 | Discharge: 2022-09-19 | Disposition: A | Discharge status: Home / Self Care | Payer: Commercial Managed Care - HMO | Attending: Emergency Medicine | Admitting: Emergency Medicine

## 2022-09-19 ENCOUNTER — Other Ambulatory Visit: Payer: Self-pay

## 2022-09-19 ENCOUNTER — Emergency Department (HOSPITAL_COMMUNITY): Payer: Commercial Managed Care - HMO

## 2022-09-19 DIAGNOSIS — R519 Headache, unspecified: Secondary | ICD-10-CM | POA: Diagnosis not present

## 2022-09-19 DIAGNOSIS — M436 Torticollis: Secondary | ICD-10-CM | POA: Diagnosis not present

## 2022-09-19 DIAGNOSIS — M542 Cervicalgia: Secondary | ICD-10-CM | POA: Diagnosis present

## 2022-09-19 MED ORDER — HYDROCODONE-ACETAMINOPHEN 5-325 MG PO TABS
1.0000 | ORAL_TABLET | Freq: Once | ORAL | Status: AC
  Administered 2022-09-19: 1 via ORAL
  Filled 2022-09-19: qty 1

## 2022-09-19 MED ORDER — LIDOCAINE 5 % EX PTCH
1.0000 | MEDICATED_PATCH | CUTANEOUS | Status: DC
  Administered 2022-09-19: 1 via TRANSDERMAL
  Filled 2022-09-19: qty 1

## 2022-09-19 MED ORDER — METHOCARBAMOL 500 MG PO TABS: 500.0000 mg | ORAL_TABLET | Freq: Three times a day (TID) | ORAL | 0 refills | Status: DC | PRN

## 2022-09-19 MED ORDER — OXYCODONE HCL 5 MG PO TABS
5.0000 mg | ORAL_TABLET | Freq: Once | ORAL | Status: AC
  Administered 2022-09-19: 5 mg via ORAL
  Filled 2022-09-19: qty 1

## 2022-09-19 MED ORDER — ONDANSETRON 4 MG PO TBDP
4.0000 mg | ORAL_TABLET | Freq: Once | ORAL | Status: AC
  Administered 2022-09-19: 4 mg via ORAL
  Filled 2022-09-19: qty 1

## 2022-09-19 MED ORDER — DIAZEPAM 2 MG PO TABS
2.0000 mg | ORAL_TABLET | Freq: Once | ORAL | Status: AC
  Administered 2022-09-19: 2 mg via ORAL
  Filled 2022-09-19: qty 1

## 2022-09-19 MED ORDER — NAPROXEN 375 MG PO TABS: 375.0000 mg | ORAL_TABLET | Freq: Two times a day (BID) | ORAL | 0 refills | Status: DC

## 2022-09-19 MED ORDER — OXYCODONE HCL 5 MG PO TABS: 2.5000 mg | ORAL_TABLET | ORAL | 0 refills | Status: DC | PRN

## 2022-09-19 NOTE — ED Notes (Signed)
Pt ambulatory at time of discharge. Ambulatory to car. Pt going home with son.

## 2022-09-19 NOTE — ED Triage Notes (Signed)
Pt c/o neck pain that started yesterday; went to bed with neck pain, worsening today; worse when turning head to right; denies known injury, denies headaches, denies light sensitivity, denies fevers

## 2022-09-19 NOTE — ED Notes (Signed)
Pt is a&ox4, warm and dry to touch. Pt is complaining of right sided neck pain that began yesterday at approx noon. Pt denies any injury, numbness or tingling in any extremities.

## 2022-09-19 NOTE — Discharge Instructions (Addendum)
Get help right away if: You have trouble breathing. You make loud, high-pitched sounds when you breathe, most often when you breathe in (stridor). You start to drool. You have trouble swallowing or pain when swallowing. You develop numbness or weakness in your hands or feet. You have changes in your speech, understanding, or vision. You are in severe pain. You cannot move your head or neck. These symptoms may represent a serious problem that is an emergency. Do not wait to see if the symptoms will go away. Get medical help right away. Call your local emergency services (911 in the [place] Do not drive yourself to the hospital.

## 2022-09-19 NOTE — ED Provider Triage Note (Signed)
Emergency Medicine Provider Triage Evaluation Note  Kaitlyn Sullivan , a 62 y.o. female  was evaluated in triage.  Patient presenting with neck pain.  Has been going on for the past 2 days.  Unable to rotate her neck.  No fevers or chills.  No photophobia.  No headache.  Has tried Flexeril and NSAIDs without relief  Review of Systems  Positive:  Negative:  Physical Exam  BP (!) 152/99   Pulse 95   Temp 99.2 F (37.3 C) (Oral)   Resp 18   Ht 5\' 5"  (1.651 m)   Wt 81.6 kg   SpO2 100%   BMI 29.95 kg/m  Gen:   Awake, no distress   Resp:  Normal effort  MSK:   Moves extremities without difficulty  Other:  Reproducible tenderness along the right trapezius.  No tenderness on the left side.  Limited range of motion with rotation and lateral flexion bilaterally Medical Decision Making  Medically screening exam initiated at 2:38 PM.  Appropriate orders placed.  Kaitlyn Sullivan was informed that the remainder of the evaluation will be completed by another provider, this initial triage assessment does not replace that evaluation, and the importance of remaining in the ED until their evaluation is complete.     Saddie Benders, PA-C 09/19/22 1439

## 2022-09-19 NOTE — ED Notes (Signed)
Discharge instructions provided by edp were reinforced with pt. Pt verbalized understanding with no additional questions at this time. Pt called ride d/t medications given, see mar.

## 2022-09-19 NOTE — ED Notes (Signed)
Pt informed her daughter called requesting an update from her. Pt states she will call her daughter. Informed pt she is currently waiting on CT scans.

## 2022-09-19 NOTE — ED Notes (Signed)
Pt requesting to go to CT now. Informed department is having a lot of high acuity traumas that need to be scanned first. That she is on the list and they will get her shortly.

## 2022-09-19 NOTE — ED Notes (Signed)
Patient transported to CT 

## 2022-09-19 NOTE — ED Provider Notes (Signed)
Kait[facility] EMERGENCY DEPARTMENT AT [facility] Provider Note   CSN: [id number] Arrival date & time: 09/19/22  1349     History  Chief Complaint  Patient presents with   Neck Pain    Kaitlyn Sullivan is a 62 y.o. female.who presents with a chief complaint of severe right-sided neck pain.  Patient reports that she was out of town and returned home from travel yesterday.  She was in her normal state of health, fell asleep and then woke up with severe right-sided neck pain radiating into the scalp.  She has pain whenever she tries to change position move or turn her neck she denies vision change, vertigo or ataxia.  She denies any known injuries.  She describes the pain as aching and severe.   Neck Pain      Home Medications Prior to Admission medications   Medication Sig Start Date End Date Taking? Authorizing Provider  methocarbamol (ROBAXIN) 500 MG tablet Take 1 tablet (500 mg total) by mouth 3 (three) times daily as needed for muscle spasms. 09/19/22  Yes Shantanique Hodo, PA-C  naproxen (NAPROSYN) 375 MG tablet Take 1 tablet (375 mg total) by mouth 2 (two) times daily with a meal. 09/19/22  Yes Kei Langhorst, PA-C  oxyCODONE (ROXICODONE) 5 MG immediate release tablet Take 0.5-1 tablets (2.5-5 mg total) by mouth every 4 (four) hours as needed for severe pain. 09/19/22  Yes Hallie Ishida, PA-C  ALPRAZolam Prudy Feeler) 0.5 MG tablet Take 1 tablet by mouth daily as needed. 04/07/21     ALPRAZolam (XANAX) 0.5 MG tablet Take 1 tablet by mouth every day as needed. 08/11/21     ALPRAZolam (XANAX) 1 MG tablet Take 1 tablet every 6 to 8 hours as needed. 09/06/21   Contogiannis, Chales Abrahams, MD  atovaquone-proguanil (MALARONE) 250-100 MG TABS tablet Take 1 tablet by mouth daily with meals 04/08/22     butalbital-acetaminophen-caffeine (FIORICET, ESGIC) 50-325-40 MG per tablet Take 1 tablet by mouth 3 (three) times daily as needed for headache. Patient not taking: Reported on 07/21/2014  07/22/13   Huston Foley, MD  cephALEXin (KEFLEX) 500 MG capsule Take 1 capsule by mouth 4 times a day 08/30/21   Contogiannis, Chales Abrahams, MD  cholecalciferol (VITAMIN D) 1000 UNITS tablet Take 1,000 Units by mouth daily.    [provider]  cyclobenzaprine (FLEXERIL) 5 MG tablet Take 1 tablet (5 mg total) by mouth 3 (three) times daily. 01/21/22     cycloSPORINE (RESTASIS) 0.05 % ophthalmic emulsion Instill 1 drop into both eyes twice a day 04/15/22     Dapsone (ACZONE) 5 % topical gel Apply to skin twice a day 08/11/21     Dapsone (ACZONE) 5 % topical gel Apply topically twice a day. 03/08/22     diclofenac Sodium (VOLTAREN) 1 % GEL Apply to affected area 2 times a day as needed 01/13/22     fluconazole (DIFLUCAN) 150 MG tablet Take 1 tablet by mouth now then repeat in 3 days if needed 07/13/22     hydrocortisone (ANUSOL-HC) 2.5 % rectal cream APPLY A THIN LAYER TO THE AFFECTED AREA(S) OF SKIN 2-4 TIMES DAILY 09/30/21     Lifitegrast (XIIDRA) 5 % SOLN Instill 1 drop into both eyes twice a day 03/16/21     Lifitegrast (XIIDRA) 5 % SOLN Place 1 drop into both eyes 2 (two) times daily. 04/14/22     Lifitegrast (XIIDRA) 5 % SOLN Place 1 drop into both eyes 2 (two) times daily. 04/14/22  mefloquine (LARIAM) 250 MG tablet Take 1 tablet by mouth once a week. 04/07/21     metFORMIN (GLUCOPHAGE-XR) 500 MG 24 hr tablet Take 1 tablet by mouth twice a day for weight loss. 06/24/22     Multiple Vitamin (MULTIVITAMIN) tablet Take 1 tablet by mouth daily.    [provider]  Naltrexone-buPROPion HCl ER (CONTRAVE) 8-90 MG TB12 Take 1 tablet by mouth daily x7 days--1 tablet twice daily x7 days-- 2 tablets in AM and 1 tablet in PM x7 days-- 2 tablets twice daily x7 days 09/07/21     phentermine (ADIPEX-P) 37.5 MG tablet TAKE 1 TABLET BY MOUTH DAILY 02/10/20 08/08/20  Jamison Oka B, MD  phentermine (ADIPEX-P) 37.5 MG tablet Take 1 Tablet by mouth once daily 09/03/20     phentermine (ADIPEX-P) 37.5 MG tablet  Take 1 tablet (37.5 mg total) by mouth daily. 04/05/22     phentermine (ADIPEX-P) 37.5 MG tablet Take 1 tablet (37.5 mg total) by mouth daily. 06/24/22     phentermine 37.5 MG capsule Take 37.5 mg by mouth every morning.    [provider]  phentermine 37.5 MG capsule TAKE 1 CAPSULE BY MOUTH ONCE A DAY FOR 30 DAYS 03/06/20 09/02/20  Jamison Oka B, MD  phentermine 37.5 MG capsule Take 1 capsule by mouth once daily 09/03/20     promethazine (PHENERGAN) 25 MG tablet TAKE 1 TABLET BY MOUTH EVERY 8 HOURS AS NEEDED 06/29/20 06/29/21  Merri Brunette, MD  Semaglutide-Weight Management (WEGOVY) 0.25 MG/0.5ML SOAJ Inject 1 pen (0.25 mg)  into the skin every week by subcutaneous route. 08/11/21     Semaglutide-Weight Management (WEGOVY) 0.25 MG/0.5ML SOAJ Inject 1 pen (0.25 mg) into the skin every week 08/11/21     senna (SENOKOT) 8.6 MG tablet Take 2 tablets by mouth at bedtime *May take 1 tablet when stool is loose* 09/30/21     traMADol (ULTRAM) 50 MG tablet Take 1-2 tablets (50-100 mg total) by mouth every 4-6 hours as needed for pain 09/01/21   Contogiannis, Chales Abrahams, MD  traMADol (ULTRAM) 50 MG tablet Take 1 to 2 tablets by mouth every 4 to 6 hours as needed for pain. 09/06/21   Contogiannis, Chales Abrahams, MD  tretinoin (RETIN-A) 0.1 % cream APPLY topically 20 to 30 minutes after washing and drying face at night 08/11/21         Allergies    Patient has no known allergies.    Review of Systems   Review of Systems  Musculoskeletal:  Positive for neck pain.    Physical Exam Updated Vital Signs BP (!) 156/96 (BP Location: Left Arm)   Pulse 77   Temp 98 F (36.7 C) (Axillary)   Resp 20   Ht 5\' 5"  (1.651 m)   Wt 81.6 kg   SpO2 96%   BMI 29.95 kg/m  Physical Exam Vitals and nursing note reviewed.  Constitutional:      General: She is not in acute distress.    Appearance: She is well-developed. She is not diaphoretic.  HENT:     Head: Normocephalic and atraumatic.     Right Ear: External ear  normal.     Left Ear: External ear normal.     Nose: Nose normal.     Mouth/Throat:     Mouth: Mucous membranes are moist.  Eyes:     General: No scleral icterus.    Conjunctiva/sclera: Conjunctivae normal.  Neck:   Cardiovascular:     Rate and Rhythm:  Normal rate and regular rhythm.     Heart sounds: Normal heart sounds. No murmur heard.    No friction rub. No gallop.  Pulmonary:     Effort: Pulmonary effort is normal. No respiratory distress.     Breath sounds: Normal breath sounds.  Abdominal:     General: Bowel sounds are normal. There is no distension.     Palpations: Abdomen is soft. There is no mass.     Tenderness: There is no abdominal tenderness. There is no guarding.  Musculoskeletal:     Cervical back: Torticollis present. Muscular tenderness present. No spinous process tenderness. Decreased range of motion.  Skin:    General: Skin is warm and dry.  Neurological:     Mental Status: She is alert and oriented to person, place, and time.     Cranial Nerves: No cranial nerve deficit.     Sensory: No sensory deficit.     Motor: No weakness.     Coordination: Coordination normal.  Psychiatric:        Behavior: Behavior normal.     ED Results / Procedures / Treatments   Labs (all labs ordered are listed, but only abnormal results are displayed) Labs Reviewed - No data to display  EKG None  Radiology CT Cervical Spine Wo Contrast  Result Date: 09/19/2022 CLINICAL DATA:  Head and neck pain. EXAM: CT HEAD WITHOUT CONTRAST CT CERVICAL SPINE WITHOUT CONTRAST TECHNIQUE: Multidetector CT imaging of the head and cervical spine was performed following the standard protocol without intravenous contrast. Multiplanar CT image reconstructions of the cervical spine were also generated. RADIATION DOSE REDUCTION: This exam was performed according to the departmental dose-optimization program which includes automated exposure control, adjustment of the mA and/or kV according to  patient size and/or use of iterative reconstruction technique. COMPARISON:  CT examination dated Sep 02, 2021 FINDINGS: CT HEAD FINDINGS Brain: No evidence of acute infarction, hemorrhage, hydrocephalus, extra-axial collection or mass lesion/mass effect. Vascular: No hyperdense vessel or unexpected calcification. Skull: Normal. Negative for fracture or focal lesion. Sinuses/Orbits: No acute finding. Other: None. CT CERVICAL SPINE FINDINGS Alignment: Straightening of the cervical spine. Skull base and vertebrae: No acute fracture. No primary bone lesion or focal pathologic process. Soft tissues and spinal canal: No prevertebral fluid or swelling. No visible canal hematoma. Disc levels: Disc height loss and osteophytes at C3-C4. Mild uncovertebral joint arthropathy. Mild neural foraminal stenosis. Upper chest: Negative. Other: None IMPRESSION: CT HEAD: No acute intracranial abnormality. CT CERVICAL SPINE: 1. No acute fracture or subluxation. 2. Mild degenerative disc disease at C3-C4. Electronically Signed   By: Larose Hires D.O.   On: 09/19/2022 22:05   CT Head Wo Contrast  Result Date: 09/19/2022 CLINICAL DATA:  Head and neck pain. EXAM: CT HEAD WITHOUT CONTRAST CT CERVICAL SPINE WITHOUT CONTRAST TECHNIQUE: Multidetector CT imaging of the head and cervical spine was performed following the standard protocol without intravenous contrast. Multiplanar CT image reconstructions of the cervical spine were also generated. RADIATION DOSE REDUCTION: This exam was performed according to the departmental dose-optimization program which includes automated exposure control, adjustment of the mA and/or kV according to patient size and/or use of iterative reconstruction technique. COMPARISON:  CT examination dated Sep 02, 2021 FINDINGS: CT HEAD FINDINGS Brain: No evidence of acute infarction, hemorrhage, hydrocephalus, extra-axial collection or mass lesion/mass effect. Vascular: No hyperdense vessel or unexpected calcification.  Skull: Normal. Negative for fracture or focal lesion. Sinuses/Orbits: No acute finding. Other: None. CT CERVICAL SPINE FINDINGS Alignment: Straightening  of the cervical spine. Skull base and vertebrae: No acute fracture. No primary bone lesion or focal pathologic process. Soft tissues and spinal canal: No prevertebral fluid or swelling. No visible canal hematoma. Disc levels: Disc height loss and osteophytes at C3-C4. Mild uncovertebral joint arthropathy. Mild neural foraminal stenosis. Upper chest: Negative. Other: None IMPRESSION: CT HEAD: No acute intracranial abnormality. CT CERVICAL SPINE: 1. No acute fracture or subluxation. 2. Mild degenerative disc disease at C3-C4. Electronically Signed   By: Larose Hires D.O.   On: 09/19/2022 22:05    Procedures Procedures    Medications Ordered in ED Medications  lidocaine (LIDODERM) 5 % 1 patch (1 patch Transdermal Patch Applied 09/19/22 1943)  oxyCODONE (Oxy IR/ROXICODONE) immediate release tablet 5 mg (has no administration in time range)  HYDROcodone-acetaminophen (NORCO/VICODIN) 5-325 MG per tablet 1 tablet (1 tablet Oral Given 09/19/22 1942)  ondansetron (ZOFRAN-ODT) disintegrating tablet 4 mg (4 mg Oral Given 09/19/22 1942)  diazepam (VALIUM) tablet 2 mg (2 mg Oral Given 09/19/22 2011)    ED Course/ Medical Decision Making/ A&P Clinical Course as of 09/19/22 2252  Mon Sep 19, 2022  2216 CT Cervical Spine Wo Contrast [AH]  2217 CT Head Wo Contrast [AH]    Clinical Course User Index [AH] Arthor Captain, PA-C                             Medical Decision Making Amount and/or Complexity of Data Reviewed Radiology: ordered and independent interpretation performed. Decision-making details documented in ED Course.  Risk Prescription drug management.   62 year old female with acute neck pain after waking up.  No injuries noted.  I have considered in my differential occipital neuralgia, trigeminal neuralgia, vertebral artery dissection.  I have  low suspicion for all of these.  She does not have any obvious rash which would make me think that she is having a zoster outbreak.  Patient has palpable, tender spasm of the right side of the neck with difficulty moving the neck due to pain.  She was given Valium and Norco with significant improvement in her pain.  I ordered imaging and interpreted these images including CT head and C-spine which showed some mild degenerative changes without acute finding.  Patient will be discharged with pain medication, anti-inflammatories, muscle relaxer and outpatient follow-up.  Discussed return precautions.        Final Clinical Impression(s) / ED Diagnoses Final diagnoses:  Acute torticollis    Rx / DC Orders ED Discharge Orders          Ordered    oxyCODONE (ROXICODONE) 5 MG immediate release tablet  Every 4 hours PRN        09/19/22 2252    naproxen (NAPROSYN) 375 MG tablet  2 times daily with meals        09/19/22 2252    methocarbamol (ROBAXIN) 500 MG tablet  3 times daily PRN        09/19/22 2252              Arthor Captain, PA-C 09/19/22 2252    Wynetta Fines, MD 09/19/22 [phone]

## 2022-09-21 ENCOUNTER — Other Ambulatory Visit (HOSPITAL_COMMUNITY): Payer: Self-pay

## 2022-09-21 MED ORDER — IBUPROFEN 600 MG PO TABS
600.0000 mg | ORAL_TABLET | Freq: Three times a day (TID) | ORAL | 0 refills | Status: DC
  Filled 2022-09-21: qty 15, 5d supply, fill #0

## 2022-09-30 ENCOUNTER — Emergency Department (HOSPITAL_COMMUNITY): Payer: Commercial Managed Care - HMO

## 2022-09-30 ENCOUNTER — Other Ambulatory Visit: Payer: Self-pay

## 2022-09-30 ENCOUNTER — Encounter (HOSPITAL_COMMUNITY): Payer: Self-pay

## 2022-09-30 ENCOUNTER — Emergency Department (HOSPITAL_COMMUNITY)
Admission: EM | Admit: 2022-09-30 | Discharge: 2022-09-30 | Disposition: A | Discharge status: Home / Self Care | Payer: Commercial Managed Care - HMO | Attending: Emergency Medicine | Admitting: Emergency Medicine

## 2022-09-30 DIAGNOSIS — W108XXA Fall (on) (from) other stairs and steps, initial encounter: Secondary | ICD-10-CM | POA: Diagnosis not present

## 2022-09-30 DIAGNOSIS — S63502A Unspecified sprain of left wrist, initial encounter: Secondary | ICD-10-CM

## 2022-09-30 DIAGNOSIS — S060X1A Concussion with loss of consciousness of 30 minutes or less, initial encounter: Secondary | ICD-10-CM

## 2022-09-30 DIAGNOSIS — S0990XA Unspecified injury of head, initial encounter: Secondary | ICD-10-CM | POA: Diagnosis present

## 2022-09-30 DIAGNOSIS — W19XXXA Unspecified fall, initial encounter: Secondary | ICD-10-CM

## 2022-09-30 DIAGNOSIS — S0083XA Contusion of other part of head, initial encounter: Secondary | ICD-10-CM | POA: Diagnosis not present

## 2022-09-30 LAB — BASIC METABOLIC PANEL
Anion gap: 9 (ref 5–15)
BUN: 11 mg/dL (ref 8–23)
CO2: 25 mmol/L (ref 22–32)
Calcium: 9.3 mg/dL (ref 8.9–10.3)
Chloride: 100 mmol/L (ref 98–111)
Creatinine, Ser: 0.93 mg/dL (ref 0.44–1.00)
GFR, Estimated: >60 mL/min (ref >60)
Glucose, Bld: 84 mg/dL (ref 70–99)
Potassium: 3.5 mmol/L (ref 3.5–5.1)
Sodium: 134 mmol/L — ABNORMAL LOW (ref 135–145)

## 2022-09-30 LAB — CBC WITH DIFFERENTIAL/PLATELET
Abs Immature Granulocytes: 0.02 K/uL (ref 0.00–0.07)
Basophils Absolute: 0.1 K/uL (ref 0.0–0.1)
Basophils Relative: 1 %
Eosinophils Absolute: 0.2 K/uL (ref 0.0–0.5)
Eosinophils Relative: 3 %
HCT: 34.4 % — ABNORMAL LOW (ref 36.0–46.0)
Hemoglobin: 11.8 g/dL — ABNORMAL LOW (ref 12.0–15.0)
Immature Granulocytes: 0 %
Lymphocytes Relative: 34 %
Lymphs Abs: 2.1 K/uL (ref 0.7–4.0)
MCH: 28.2 pg (ref 26.0–34.0)
MCHC: 34.3 g/dL (ref 30.0–36.0)
MCV: 82.1 fL (ref 80.0–100.0)
Monocytes Absolute: 0.6 K/uL (ref 0.1–1.0)
Monocytes Relative: 9 %
Neutro Abs: 3.2 K/uL (ref 1.7–7.7)
Neutrophils Relative %: 53 %
Platelets: 439 K/uL — ABNORMAL HIGH (ref 150–400)
RBC: 4.19 MIL/uL (ref 3.87–5.11)
RDW: 12.4 % (ref 11.5–15.5)
WBC: 6.1 K/uL (ref 4.0–10.5)
nRBC: 0 % (ref 0.0–0.2)

## 2022-09-30 MED ORDER — METOCLOPRAMIDE HCL 5 MG/ML IJ SOLN
10.0000 mg | Freq: Once | INTRAMUSCULAR | Status: AC
  Administered 2022-09-30: 10 mg via INTRAVENOUS
  Filled 2022-09-30: qty 2

## 2022-09-30 MED ORDER — SODIUM CHLORIDE 0.9 % IV SOLN: INTRAVENOUS | Status: DC

## 2022-09-30 MED ORDER — SODIUM CHLORIDE 0.9 % IV BOLUS
1000.0000 mL | Freq: Once | INTRAVENOUS | Status: AC
  Administered 2022-09-30: 1000 mL via INTRAVENOUS

## 2022-09-30 MED ORDER — DIPHENHYDRAMINE HCL 50 MG/ML IJ SOLN
12.5000 mg | Freq: Once | INTRAMUSCULAR | Status: AC
  Administered 2022-09-30: 12.5 mg via INTRAVENOUS
  Filled 2022-09-30: qty 1

## 2022-09-30 MED ORDER — HYDROCODONE-ACETAMINOPHEN 5-325 MG PO TABS: 1.0000 | ORAL_TABLET | ORAL | 0 refills | Status: DC | PRN

## 2022-09-30 MED ORDER — BACITRACIN ZINC 500 UNIT/GM EX OINT
TOPICAL_OINTMENT | Freq: Once | CUTANEOUS | Status: AC
  Administered 2022-09-30: 1 via TOPICAL
  Filled 2022-09-30: qty 0.9

## 2022-09-30 NOTE — Progress Notes (Signed)
   09/30/22 1641  Spiritual Encounters  Type of Visit Initial  Care provided to: Patient  Referral source Code page  Reason for visit Trauma  OnCall Visit No  Advance Directives (For Healthcare)  Does Patient Have a Medical Advance Directive? No  Would patient like information on creating a medical advance directive? No - Patient declined  Mental Health Advance Directives  Does Patient Have a Mental Health Advance Directive? No  Would patient like information on creating a mental health advance directive? No - Patient declined   Ch responded to trauma page. Pt's daughter was at bedside. Ch provided hospitality and compassionate presence. No follow-up needed at this time.

## 2022-09-30 NOTE — ED Triage Notes (Signed)
Per EMS and pt report, the pt tripped and fell down 3-4 steps. The pt is not on blood thinners. Pt was unresponsive for about 5 minutes. Pt arrived to ED with c-collar in place. Is having face, chest, and left wrist pain. Rates the pain 10/10.

## 2022-09-30 NOTE — Progress Notes (Signed)
Orthopedic Tech Progress Note Patient Details:  Kaitlyn Sullivan 10-14-1960 [id number]  Patient ID: Kaitlyn Sullivan, female   DOB: [birth year], 62 y.o.   MRN: [id number] Level II; not currently needed. Darleen Crocker 09/30/2022, 4:34 PM
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
## 2022-10-12 ENCOUNTER — Other Ambulatory Visit (HOSPITAL_COMMUNITY): Payer: Self-pay

## 2022-10-12 ENCOUNTER — Other Ambulatory Visit: Payer: Self-pay | Admitting: Internal Medicine

## 2022-10-12 ENCOUNTER — Ambulatory Visit
Admission: RE | Admit: 2022-10-12 | Discharge: 2022-10-12 | Disposition: A | Discharge status: Home / Self Care | Payer: Commercial Managed Care - HMO | Source: Ambulatory Visit | Attending: Internal Medicine | Admitting: Internal Medicine

## 2022-10-12 DIAGNOSIS — R0782 Intercostal pain: Secondary | ICD-10-CM

## 2022-10-12 DIAGNOSIS — R0789 Other chest pain: Secondary | ICD-10-CM

## 2022-10-12 MED ORDER — METHOCARBAMOL 500 MG PO TABS
500.0000 mg | ORAL_TABLET | Freq: Every evening | ORAL | 0 refills | Status: DC | PRN
  Filled 2022-10-12: qty 10, 10d supply, fill #0

## 2022-10-12 MED ORDER — IBUPROFEN 600 MG PO TABS
600.0000 mg | ORAL_TABLET | Freq: Three times a day (TID) | ORAL | 0 refills | Status: DC
  Filled 2022-10-12: qty 15, 5d supply, fill #0

## 2022-10-13 ENCOUNTER — Ambulatory Visit (INDEPENDENT_AMBULATORY_CARE_PROVIDER_SITE_OTHER): Payer: Commercial Managed Care - HMO | Admitting: Sports Medicine

## 2022-10-13 VITALS — BP 132/80 | HR 97 | Ht 65.0 in | Wt 184.0 lb

## 2022-10-13 DIAGNOSIS — R4020 Unspecified coma: Secondary | ICD-10-CM

## 2022-10-13 DIAGNOSIS — M542 Cervicalgia: Secondary | ICD-10-CM

## 2022-10-13 DIAGNOSIS — R0781 Pleurodynia: Secondary | ICD-10-CM | POA: Diagnosis not present
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
## 2022-10-13 NOTE — Patient Instructions (Addendum)
You do not have a concussion  Continue Ibuprofen 600 mg 4x a day  Neck HEP  Follow up with [place] or PCP in 4 weeeks

## 2022-10-28 ENCOUNTER — Other Ambulatory Visit (HOSPITAL_COMMUNITY): Payer: Self-pay

## 2022-10-28 MED ORDER — AMOXICILLIN-POT CLAVULANATE 875-125 MG PO TABS
1.0000 | ORAL_TABLET | Freq: Two times a day (BID) | ORAL | 0 refills | Status: AC
  Filled 2022-10-28 – 2022-10-29 (×2): qty 10, 5d supply, fill #0

## 2022-10-29 ENCOUNTER — Other Ambulatory Visit (HOSPITAL_COMMUNITY): Payer: Self-pay

## 2022-10-31 ENCOUNTER — Other Ambulatory Visit: Payer: Self-pay | Admitting: Internal Medicine

## 2022-10-31 DIAGNOSIS — E010 Iodine-deficiency related diffuse (endemic) goiter: Secondary | ICD-10-CM

## 2022-10-31 DIAGNOSIS — E049 Nontoxic goiter, unspecified: Secondary | ICD-10-CM

## 2022-11-10 ENCOUNTER — Ambulatory Visit
Admission: RE | Admit: 2022-11-10 | Discharge: 2022-11-10 | Disposition: A | Discharge status: Home / Self Care | Payer: Commercial Managed Care - HMO | Source: Ambulatory Visit | Attending: Internal Medicine | Admitting: Internal Medicine

## 2022-11-10 DIAGNOSIS — E049 Nontoxic goiter, unspecified: Secondary | ICD-10-CM

## 2022-11-10 DIAGNOSIS — E010 Iodine-deficiency related diffuse (endemic) goiter: Secondary | ICD-10-CM

## 2023-03-13 ENCOUNTER — Other Ambulatory Visit (HOSPITAL_COMMUNITY): Payer: Self-pay

## 2023-03-13 MED ORDER — PREDNISONE 20 MG PO TABS
40.0000 mg | ORAL_TABLET | Freq: Every day | ORAL | 0 refills | Status: DC
  Filled 2023-03-13: qty 10, 5d supply, fill #0

## 2023-03-13 MED ORDER — MONTELUKAST SODIUM 10 MG PO TABS
10.0000 mg | ORAL_TABLET | Freq: Every morning | ORAL | 1 refills | Status: DC
  Filled 2023-03-13: qty 30, 30d supply, fill #0

## 2023-03-13 MED ORDER — LEVOCETIRIZINE DIHYDROCHLORIDE 5 MG PO TABS
5.0000 mg | ORAL_TABLET | Freq: Every evening | ORAL | 0 refills | Status: DC
  Filled 2023-03-13: qty 30, 30d supply, fill #0

## 2023-03-13 MED ORDER — FEXOFENADINE HCL 180 MG PO TABS: 180.0000 mg | ORAL_TABLET | Freq: Every morning | ORAL | 1 refills | Status: DC

## 2023-04-22 ENCOUNTER — Other Ambulatory Visit (HOSPITAL_COMMUNITY): Payer: Self-pay

## 2023-04-22 MED ORDER — ATOVAQUONE-PROGUANIL HCL 250-100 MG PO TABS
1.0000 | ORAL_TABLET | Freq: Every day | ORAL | 0 refills | Status: DC
  Filled 2023-04-22: qty 60, 60d supply, fill #0

## 2023-04-22 MED ORDER — XIIDRA 5 % OP SOLN
1.0000 [drp] | Freq: Two times a day (BID) | OPHTHALMIC | 2 refills | Status: DC
  Filled 2023-04-22: qty 60, 30d supply, fill #0

## 2023-04-22 MED ORDER — CYCLOSPORINE 0.05 % OP EMUL
1.0000 [drp] | Freq: Two times a day (BID) | OPHTHALMIC | 1 refills | Status: DC
  Filled 2023-04-22: qty 60, 30d supply, fill #0

## 2023-04-24 ENCOUNTER — Other Ambulatory Visit (HOSPITAL_COMMUNITY): Payer: Self-pay

## 2023-04-24 ENCOUNTER — Other Ambulatory Visit: Payer: Self-pay

## 2023-04-25 ENCOUNTER — Other Ambulatory Visit (HOSPITAL_COMMUNITY): Payer: Self-pay

## 2023-06-14 ENCOUNTER — Other Ambulatory Visit (HOSPITAL_COMMUNITY): Payer: Self-pay

## 2023-06-14 MED ORDER — AMOXICILLIN-POT CLAVULANATE 875-125 MG PO TABS
1.0000 | ORAL_TABLET | Freq: Two times a day (BID) | ORAL | 0 refills | Status: DC
  Filled 2023-06-14: qty 14, 7d supply, fill #0

## 2023-06-14 MED ORDER — BENZONATATE 200 MG PO CAPS
200.0000 mg | ORAL_CAPSULE | Freq: Three times a day (TID) | ORAL | 0 refills | Status: DC
  Filled 2023-06-14: qty 21, 7d supply, fill #0

## 2023-06-15 ENCOUNTER — Ambulatory Visit
Admission: RE | Admit: 2023-06-15 | Discharge: 2023-06-15 | Disposition: A | Discharge status: Home / Self Care | Payer: 59 | Source: Ambulatory Visit | Attending: Internal Medicine | Admitting: Internal Medicine

## 2023-06-15 ENCOUNTER — Other Ambulatory Visit: Payer: Self-pay | Admitting: Internal Medicine

## 2023-06-15 DIAGNOSIS — J209 Acute bronchitis, unspecified: Secondary | ICD-10-CM | POA: Diagnosis not present

## 2023-06-23 DIAGNOSIS — L503 Dermatographic urticaria: Secondary | ICD-10-CM | POA: Diagnosis not present

## 2023-06-28 DIAGNOSIS — D649 Anemia, unspecified: Secondary | ICD-10-CM | POA: Diagnosis not present

## 2023-07-17 ENCOUNTER — Other Ambulatory Visit (HOSPITAL_COMMUNITY): Payer: Self-pay

## 2023-07-17 DIAGNOSIS — R0789 Other chest pain: Secondary | ICD-10-CM | POA: Diagnosis not present

## 2023-07-17 DIAGNOSIS — G4452 New daily persistent headache (NDPH): Secondary | ICD-10-CM | POA: Diagnosis not present

## 2023-07-17 DIAGNOSIS — I158 Other secondary hypertension: Secondary | ICD-10-CM | POA: Diagnosis not present

## 2023-07-17 DIAGNOSIS — J00 Acute nasopharyngitis [common cold]: Secondary | ICD-10-CM | POA: Diagnosis not present

## 2023-07-17 MED ORDER — IBUPROFEN 600 MG PO TABS
ORAL_TABLET | ORAL | 0 refills | Status: DC
  Filled 2023-07-17: qty 30, 10d supply, fill #0

## 2023-07-17 MED ORDER — FLUTICASONE PROPIONATE 50 MCG/ACT NA SUSP
NASAL | 3 refills | Status: AC
  Filled 2023-07-17: qty 16, 30d supply, fill #0

## 2023-07-18 ENCOUNTER — Other Ambulatory Visit: Payer: Self-pay | Admitting: Internal Medicine

## 2023-07-18 ENCOUNTER — Other Ambulatory Visit: Payer: Self-pay | Admitting: Physical Medicine and Rehabilitation

## 2023-07-18 ENCOUNTER — Other Ambulatory Visit: Payer: Self-pay

## 2023-07-18 ENCOUNTER — Encounter: Payer: Self-pay | Admitting: Internal Medicine

## 2023-07-18 DIAGNOSIS — G4452 New daily persistent headache (NDPH): Secondary | ICD-10-CM

## 2023-07-24 ENCOUNTER — Encounter: Payer: Self-pay | Admitting: Internal Medicine

## 2023-07-25 ENCOUNTER — Ambulatory Visit
Admission: RE | Admit: 2023-07-25 | Discharge: 2023-07-25 | Discharge status: Home / Self Care | Source: Ambulatory Visit | Attending: Internal Medicine | Admitting: Internal Medicine

## 2023-07-25 DIAGNOSIS — G4452 New daily persistent headache (NDPH): Secondary | ICD-10-CM

## 2023-07-25 DIAGNOSIS — R519 Headache, unspecified: Secondary | ICD-10-CM | POA: Diagnosis not present

## 2023-08-02 DIAGNOSIS — Z1231 Encounter for screening mammogram for malignant neoplasm of breast: Secondary | ICD-10-CM | POA: Diagnosis not present

## 2023-08-02 DIAGNOSIS — Z6832 Body mass index (BMI) 32.0-32.9, adult: Secondary | ICD-10-CM | POA: Diagnosis not present

## 2023-08-02 DIAGNOSIS — Z01419 Encounter for gynecological examination (general) (routine) without abnormal findings: Secondary | ICD-10-CM | POA: Diagnosis not present

## 2023-08-04 ENCOUNTER — Ambulatory Visit
Admission: RE | Admit: 2023-08-04 | Discharge: 2023-08-04 | Disposition: A | Discharge status: Home / Self Care | Source: Ambulatory Visit | Attending: Internal Medicine | Admitting: Internal Medicine

## 2023-08-04 ENCOUNTER — Other Ambulatory Visit: Payer: Self-pay | Admitting: Internal Medicine

## 2023-08-04 DIAGNOSIS — Z8701 Personal history of pneumonia (recurrent): Secondary | ICD-10-CM | POA: Diagnosis not present

## 2023-08-04 DIAGNOSIS — J189 Pneumonia, unspecified organism: Secondary | ICD-10-CM

## 2023-08-17 DIAGNOSIS — G5601 Carpal tunnel syndrome, right upper limb: Secondary | ICD-10-CM | POA: Diagnosis not present

## 2023-08-17 DIAGNOSIS — E7849 Other hyperlipidemia: Secondary | ICD-10-CM | POA: Diagnosis not present

## 2023-08-28 DIAGNOSIS — Z961 Presence of intraocular lens: Secondary | ICD-10-CM | POA: Diagnosis not present

## 2023-08-29 DIAGNOSIS — Z01818 Encounter for other preprocedural examination: Secondary | ICD-10-CM | POA: Diagnosis not present

## 2023-08-30 ENCOUNTER — Ambulatory Visit
Admission: RE | Admit: 2023-08-30 | Discharge: 2023-08-30 | Disposition: A | Discharge status: Home / Self Care | Source: Ambulatory Visit | Attending: Family Medicine | Admitting: Family Medicine

## 2023-08-30 ENCOUNTER — Other Ambulatory Visit: Payer: Self-pay | Admitting: Family Medicine

## 2023-08-30 DIAGNOSIS — Z01818 Encounter for other preprocedural examination: Secondary | ICD-10-CM | POA: Diagnosis not present

## 2023-09-06 ENCOUNTER — Ambulatory Visit: Attending: Cardiology | Admitting: Cardiology

## 2023-09-06 VITALS — BP 130/70 | HR 89 | Ht 65.0 in | Wt 196.0 lb

## 2023-09-06 DIAGNOSIS — Z01818 Encounter for other preprocedural examination: Secondary | ICD-10-CM

## 2023-09-06 NOTE — Patient Instructions (Signed)
 Medication Instructions:  Your physician recommends that you continue on your current medications as directed. Please refer to the Current Medication list given to you today.    *If you need a refill on your cardiac medications before your next appointment, please call your pharmacy*   Lab Work: None    If you have labs (blood work) drawn today and your tests are completely normal, you will receive your results only by: MyChart Message (if you have MyChart) OR A paper copy in the mail If you have any lab test that is abnormal or we need to change your treatment, we will call you to review the results.   Testing/Procedures: None    Follow-Up: At [facility], you and your health needs are our priority.  As part of our continuing mission to provide you with exceptional heart care, we have created designated Provider Care Teams.  These Care Teams include your primary Cardiologist (physician) and Advanced Practice Providers (APPs -  Physician Assistants and Nurse Practitioners) who all work together to provide you with the care you need, when you need it.  We recommend signing up for the patient portal called "MyChart".  Sign up information is provided on this After Visit Summary.  MyChart is used to connect with patients for Virtual Visits (Telemedicine).  Patients are able to view lab/test results, encounter notes, upcoming appointments, etc.  Non-urgent messages can be sent to your provider as well.   To learn more about what you can do with MyChart, go to [email or web address].    Your next appointment:   Call or return to clinic as needed if these symptoms worsen or fail to improve as anticipated.   Provider:   Carson Clara, MD    Other Instructions

## 2023-09-06 NOTE — Progress Notes (Signed)
 Cardiology Office Note:    Date:  09/06/2023   ID:  Kaitlyn Sullivan, DOB 11-Mar-1961, MRN [id number]  PCP:  Nohemi Batters, MD  Cardiologist:  None  Electrophysiologist:  None   Referring MD: Berneda Bridges, MD   Chief Complaint  Patient presents with   Pre-op Exam    History of Present Illness:    Kaitlyn Sullivan is a 63 y.o. female with a hx of migraines, obesity who is referred by Dr Leann Prom for pre-op evaluation.  Planning liposuction.  She reports no history of heart disease.  States that she had some chest pain when she had an episode of pneumonia in February 2025, states that she would have chest pain when she coughed.  She completed course of antibiotics and has not had any chest pain since that time.  Reports she exercises twice per week on elliptical or treadmill, for 45 to 60 minutes.  She denies any exertional symptoms.  Can walk up 2 flights of stairs without stopping, denies any symptoms with this.  She denies any lightheadedness, syncope, lower extremity edema, or palpitations.  No smoking history.  No family history of heart disease.    Past Medical History:  Diagnosis Date   Abnormal reflex    Alopecia, unspecified    Migraine, unspecified, without mention of intractable migraine without mention of status migrainosus    Obesity, unspecified    Personal history of other genital system and obstetric disorders(V13.29)    Recurrent headache 07/22/2013   Transient hypertension of pregnancy, unspecified as to episode of care    Unspecified vitamin D deficiency     Past Surgical History:  Procedure Laterality Date   DILATION AND CURETTAGE OF UTERUS  2014    Current Medications: Current Meds  Medication Sig   cholecalciferol (VITAMIN D) 1000 UNITS tablet Take 1,000 Units by mouth daily.   cycloSPORINE  (RESTASIS ) 0.05 % ophthalmic emulsion Instill 1 drop into both eyes twice a day   cycloSPORINE  (RESTASIS ) 0.05 % ophthalmic emulsion Place 1 drop into affected  eye(s) every 12 (twelve) hours.   fexofenadine  (ALLEGRA ) 180 MG tablet Take 1 tablet (180 mg total) by mouth every morning.   fluticasone  (FLONASE  ALLERGY RELIEF) 50 MCG/ACT nasal spray Place 2 sprays into each nostril daily   levocetirizine (XYZAL ) 5 MG tablet Take 1 tablet (5 mg total) by mouth every evening.   Lifitegrast  (XIIDRA ) 5 % SOLN Instill 1 drop into both eyes twice a day   methocarbamol  (ROBAXIN ) 500 MG tablet Take 1 tablet by mouth 3 (three) times daily as needed.   montelukast  (SINGULAIR ) 10 MG tablet Take 1 tablet (10 mg total) by mouth every morning.   Multiple Vitamin (MULTIVITAMIN) tablet Take 1 tablet by mouth daily.   phentermine  (ADIPEX-P ) 37.5 MG tablet Take 1 tablet by mouth daily.     Allergies:   Patient has no known allergies.   Social History   Socioeconomic History   Marital status: Married    Spouse name: Not on file   Number of children: 4   Years of education: college   Highest education level: Not on file  Occupational History    Employer: UNEMPLOYED  Tobacco Use   Smoking status: Never   Smokeless tobacco: Never  Substance and Sexual Activity   Alcohol use: No   Drug use: No   Sexual activity: Not on file  Other Topics Concern   Not on file  Social History Narrative   Patient lives at home with her  family...and drinks tea.   Social Drivers of Corporate investment banker Strain: Not on file  Food Insecurity: Not on file  Transportation Needs: Not on file  Physical Activity: Not on file  Stress: Not on file  Social Connections: Not on file     Family History: The patient's family history includes Hypertension in her mother.  ROS:   Please see the history of present illness.     All other systems reviewed and are negative.  EKGs/Labs/Other Studies Reviewed:    The following studies were reviewed today:   EKG:   09/06/2023: Normal sinus rhythm, rate 81, left atrial enlargement, no ST abnormalities  Recent Labs: 09/30/2022: BUN  11; Creatinine, Ser 0.93; Hemoglobin 11.8; Platelets 439; Potassium 3.5; Sodium 134  Recent Lipid Panel No results found for: "CHOL", "TRIG", "HDL", "CHOLHDL", "VLDL", "LDLCALC", "LDLDIRECT"  Physical Exam:    VS:  BP 130/70 (BP Location: Right Arm)   Pulse 89   Ht 5\' 5"  (1.651 m)   Wt 196 lb (88.9 kg)   SpO2 97%   BMI 32.62 kg/m     Wt Readings from Last 3 Encounters:  09/06/23 196 lb (88.9 kg)  10/13/22 184 lb (83.5 kg)  09/30/22 175 lb (79.4 kg)     GEN:  Well nourished, well developed in no acute distress HEENT: Normal NECK: No JVD; No carotid bruits LYMPHATICS: No lymphadenopathy CARDIAC: RRR, no murmurs, rubs, gallops RESPIRATORY:  Clear to auscultation without rales, wheezing or rhonchi  ABDOMEN: Soft, non-tender, non-distended MUSCULOSKELETAL:  No edema; No deformity  SKIN: Warm and dry NEUROLOGIC:  Alert and oriented x 3 PSYCHIATRIC:  Normal affect   ASSESSMENT:    1. Preoperative clearance    PLAN:    Preop evaluation: Prior to liposuction.  No history of heart disease.  Good functional capacity, denies any exertional symptoms.  RCRI score 0.  Overall would classify as low risk for major adverse cardiac event and no further cardiac workup recommended prior to procedure  RTC as needed   Medication Adjustments/Labs and Tests Ordered: Current medicines are reviewed at length with the patient today.  Concerns regarding medicines are outlined above.  Orders Placed This Encounter  Procedures   EKG 12-Lead   No orders of the defined types were placed in this encounter.   Patient Instructions  Medication Instructions:  Your physician recommends that you continue on your current medications as directed. Please refer to the Current Medication list given to you today.    *If you need a refill on your cardiac medications before your next appointment, please call your pharmacy*   Lab Work: None    If you have labs (blood work) drawn today and your tests  are completely normal, you will receive your results only by: MyChart Message (if you have MyChart) OR A paper copy in the mail If you have any lab test that is abnormal or we need to change your treatment, we will call you to review the results.   Testing/Procedures: None    Follow-Up: At [facility], you and your health needs are our priority.  As part of our continuing mission to provide you with exceptional heart care, we have created designated Provider Care Teams.  These Care Teams include your primary Cardiologist (physician) and Advanced Practice Providers (APPs -  Physician Assistants and Nurse Practitioners) who all work together to provide you with the care you need, when you need it.  We recommend signing up for the patient portal called "  MyChart".  Sign up information is provided on this After Visit Summary.  MyChart is used to connect with patients for Virtual Visits (Telemedicine).  Patients are able to view lab/test results, encounter notes, upcoming appointments, etc.  Non-urgent messages can be sent to your provider as well.   To learn more about what you can do with MyChart, go to [email or web address].    Your next appointment:   Call or return to clinic as needed if these symptoms worsen or fail to improve as anticipated.   Provider:   Carson Clara, MD    Other Instructions     Signed, Wendie Hamburg, MD  09/06/2023 4:00 PM    [facility]

## 2023-09-11 ENCOUNTER — Ambulatory Visit

## 2023-11-10 DIAGNOSIS — R030 Elevated blood-pressure reading, without diagnosis of hypertension: Secondary | ICD-10-CM | POA: Diagnosis not present

## 2023-11-10 DIAGNOSIS — E559 Vitamin D deficiency, unspecified: Secondary | ICD-10-CM | POA: Diagnosis not present

## 2023-11-10 DIAGNOSIS — E7849 Other hyperlipidemia: Secondary | ICD-10-CM | POA: Diagnosis not present

## 2023-11-10 DIAGNOSIS — E538 Deficiency of other specified B group vitamins: Secondary | ICD-10-CM | POA: Diagnosis not present

## 2023-11-10 DIAGNOSIS — Z131 Encounter for screening for diabetes mellitus: Secondary | ICD-10-CM | POA: Diagnosis not present

## 2023-11-14 DIAGNOSIS — J989 Respiratory disorder, unspecified: Secondary | ICD-10-CM | POA: Diagnosis not present

## 2023-11-14 DIAGNOSIS — E669 Obesity, unspecified: Secondary | ICD-10-CM | POA: Diagnosis not present

## 2023-11-14 DIAGNOSIS — Z23 Encounter for immunization: Secondary | ICD-10-CM | POA: Diagnosis not present

## 2023-11-14 DIAGNOSIS — Z0001 Encounter for general adult medical examination with abnormal findings: Secondary | ICD-10-CM | POA: Diagnosis not present

## 2023-11-14 DIAGNOSIS — Z01411 Encounter for gynecological examination (general) (routine) with abnormal findings: Secondary | ICD-10-CM | POA: Diagnosis not present

## 2023-11-14 DIAGNOSIS — Z1231 Encounter for screening mammogram for malignant neoplasm of breast: Secondary | ICD-10-CM | POA: Diagnosis not present

## 2023-11-14 DIAGNOSIS — E7849 Other hyperlipidemia: Secondary | ICD-10-CM | POA: Diagnosis not present

## 2023-11-14 DIAGNOSIS — Z6831 Body mass index (BMI) 31.0-31.9, adult: Secondary | ICD-10-CM | POA: Diagnosis not present

## 2023-11-30 ENCOUNTER — Other Ambulatory Visit (HOSPITAL_COMMUNITY): Payer: Self-pay

## 2023-11-30 MED ORDER — ALBUTEROL SULFATE HFA 108 (90 BASE) MCG/ACT IN AERS
INHALATION_SPRAY | RESPIRATORY_TRACT | 3 refills | Status: AC
  Filled 2023-11-30 – 2024-01-24 (×2): qty 6.7, 17d supply, fill #0

## 2023-12-11 ENCOUNTER — Other Ambulatory Visit (HOSPITAL_COMMUNITY): Payer: Self-pay

## 2024-01-16 ENCOUNTER — Other Ambulatory Visit: Payer: Self-pay | Admitting: Medical Genetics

## 2024-01-22 DIAGNOSIS — E669 Obesity, unspecified: Secondary | ICD-10-CM | POA: Diagnosis not present

## 2024-01-22 DIAGNOSIS — I10 Essential (primary) hypertension: Secondary | ICD-10-CM | POA: Diagnosis not present

## 2024-01-22 DIAGNOSIS — J989 Respiratory disorder, unspecified: Secondary | ICD-10-CM | POA: Diagnosis not present

## 2024-01-22 DIAGNOSIS — Z6831 Body mass index (BMI) 31.0-31.9, adult: Secondary | ICD-10-CM | POA: Diagnosis not present

## 2024-01-22 DIAGNOSIS — Z23 Encounter for immunization: Secondary | ICD-10-CM | POA: Diagnosis not present

## 2024-01-23 ENCOUNTER — Other Ambulatory Visit (HOSPITAL_COMMUNITY): Payer: Self-pay

## 2024-01-23 MED ORDER — ALBUTEROL SULFATE HFA 108 (90 BASE) MCG/ACT IN AERS: 2.0000 | INHALATION_SPRAY | RESPIRATORY_TRACT | 3 refills | Status: AC | PRN

## 2024-01-24 ENCOUNTER — Other Ambulatory Visit (HOSPITAL_COMMUNITY): Payer: Self-pay

## 2024-01-24 MED ORDER — PHENTERMINE HCL 37.5 MG PO TABS
37.5000 mg | ORAL_TABLET | Freq: Every day | ORAL | 0 refills | Status: DC
  Filled 2024-01-24: qty 30, 30d supply, fill #0

## 2024-02-27 DIAGNOSIS — J989 Respiratory disorder, unspecified: Secondary | ICD-10-CM | POA: Diagnosis not present

## 2024-02-27 DIAGNOSIS — Z6831 Body mass index (BMI) 31.0-31.9, adult: Secondary | ICD-10-CM | POA: Diagnosis not present

## 2024-02-27 DIAGNOSIS — B3731 Acute candidiasis of vulva and vagina: Secondary | ICD-10-CM | POA: Diagnosis not present

## 2024-02-28 ENCOUNTER — Other Ambulatory Visit (HOSPITAL_COMMUNITY): Payer: Self-pay

## 2024-02-28 MED ORDER — FLUCONAZOLE 150 MG PO TABS
150.0000 mg | ORAL_TABLET | ORAL | 0 refills | Status: AC
  Filled 2024-02-28: qty 2, 6d supply, fill #0

## 2024-02-28 MED ORDER — PHENTERMINE HCL 37.5 MG PO TABS
37.5000 mg | ORAL_TABLET | Freq: Every day | ORAL | 0 refills | Status: DC
  Filled 2024-02-28: qty 30, 30d supply, fill #0

## 2024-04-04 ENCOUNTER — Ambulatory Visit

## 2024-04-04 ENCOUNTER — Other Ambulatory Visit (HOSPITAL_COMMUNITY): Payer: Self-pay

## 2024-04-04 ENCOUNTER — Encounter (INDEPENDENT_AMBULATORY_CARE_PROVIDER_SITE_OTHER): Payer: Self-pay

## 2024-04-04 VITALS — BP 127/80 | HR 90 | Temp 98.0°F | Ht 65.0 in | Wt 184.0 lb

## 2024-04-04 DIAGNOSIS — I872 Venous insufficiency (chronic) (peripheral): Secondary | ICD-10-CM | POA: Diagnosis not present

## 2024-04-04 DIAGNOSIS — J4520 Mild intermittent asthma, uncomplicated: Secondary | ICD-10-CM | POA: Diagnosis not present

## 2024-04-04 DIAGNOSIS — M79605 Pain in left leg: Secondary | ICD-10-CM | POA: Diagnosis not present

## 2024-04-04 DIAGNOSIS — M79604 Pain in right leg: Secondary | ICD-10-CM | POA: Diagnosis not present

## 2024-04-04 DIAGNOSIS — J984 Other disorders of lung: Secondary | ICD-10-CM | POA: Diagnosis not present

## 2024-04-04 DIAGNOSIS — M79662 Pain in left lower leg: Secondary | ICD-10-CM | POA: Diagnosis not present

## 2024-04-04 DIAGNOSIS — M79661 Pain in right lower leg: Secondary | ICD-10-CM | POA: Diagnosis not present

## 2024-04-04 MED ORDER — BUDESONIDE-FORMOTEROL FUMARATE 80-4.5 MCG/ACT IN AERO
2.0000 | INHALATION_SPRAY | Freq: Four times a day (QID) | RESPIRATORY_TRACT | 12 refills | Status: AC | PRN
  Filled 2024-04-04: qty 10.2, 30d supply, fill #0

## 2024-04-04 NOTE — Assessment & Plan Note (Signed)
 Unclear if has asthma.  Orders:   Pulmonary Function Test; Future   Ambulatory referral to ENT

## 2024-04-04 NOTE — Addendum Note (Signed)
 Addended by: Daeveon Zweber D on: 04/04/2024 11:33 AM   Modules accepted: Orders

## 2024-04-04 NOTE — Progress Notes (Signed)
 Pulmonology Office Visit   Subjective:  Patient ID: Kaitlyn Sullivan, female    DOB: 30-Aug-1960  MRN: [id number]  Referred by: Roanna Ezekiel NOVAK, MD  CC:  Chief Complaint  Patient presents with   Consult    Pt states that occasionally when she takes a breath, she hears a whistle in her chest. Denies SOB, but c/o heart racing once in a while. Pt denies coughing.     HPI Kaitlyn Sullivan is a 63 y.o. female with gestational HTN, obesity class 1 on phentermine , evaluate for whistling sound in chest.   Respective notes from provider reviewed as appropriate to gather relevant information for patient care.   Discussed the use of AI scribe software for clinical note transcription with the patient, who gave verbal consent to proceed.  History of Present Illness   Kaitlyn Sullivan is a 63 year old female who presents with difficulty breathing and a whistling sound in her chest. She was referred by her primary care physician for evaluation of suspected restrictive lung disease based on spirometry results.  She experiences a whistling sound in her chest that comes and goes, originating from her chest rather than her throat. The whistling occurs regardless of the time of day and can be embarrassing in social situations. No associated shortness of breath, cough, or night cough. The whistling does not worsen with exercise, and she can walk and run without limitation. The sound resolves on its own after a day or two.  She uses an albuterol  inhaler occasionally, approximately once a week, but notes that the whistling sound resolves on its own without the need for the inhaler. She has not been on steroids or any other specific treatment for this issue.  Her family history is notable for her children having asthma and bronchitis, with her youngest child using an inhaler. She denies any personal history of asthma, COPD, or other diagnosed lung conditions. She has no family history of asthma  in her parents.  Socially, she worked as a scientist, research (medical) from 2004 to 2016, which may have involved exposure to chemicals. 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 She moved from [place] to the [place]  in 2002.  In the review of symptoms, she denies any leg swelling, weight loss, or changes in voice. She reports a tingling sensation in her legs but no other significant symptoms.      ASTHMA:  First diagnosed: not diagnosed.  FH: children with asthma.  Triggers: has whistling sound in chest which has no particular trigger.  Last steroid use: none.  Times albuterol  used: rarely use maybe once a week.  Treatment: albuterol  inhaler.    Lung Health: Functional status: unlimited. Can run for 10-15 min.  Covid vaccine: UTD Influenza vaccine: will take  Pneumonococcal vaccine: UTD.  Smoking: never.  Occupational exposure/pets: home. Moved from [place] 20 years ago. No exposures. 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    PRIOR TESTS and IMAGING: Chest x-ray April 2025: Reviewed by me there is mild Hyperinflation but no parenchymal abnormality  01/22/2024: Spirometry in PCP office FEV 142% predicted, FVC 39% predicted, with normal ratio.  Suggestion of restriction.      No data to display          Allergies: Patient has no known allergies.  Current Outpatient Medications:    albuterol  (PROVENTIL  HFA) 108 (90 Base) MCG/ACT inhaler, Inhale 2 puffs into the lungs every 4 (four) hours as needed for wheezing, Disp: 6.7 g, Rfl:  3   budesonide-formoterol (SYMBICORT) 80-4.5 MCG/ACT inhaler, Inhale 2 puffs into the lungs every 6 (six) hours as needed (whistling sound/sob.)., Disp: 1 each, Rfl: 12   cholecalciferol (VITAMIN D) 1000 UNITS tablet, Take 1,000 Units by mouth daily., Disp: , Rfl:    cycloSPORINE  (RESTASIS ) 0.05 % ophthalmic emulsion, Instill 1 drop into both eyes twice a day (Patient taking differently: Place 1 drop into both eyes 2 (two) times daily as needed.), Disp: 60  each, Rfl: 11   metFORMIN  (GLUCOPHAGE -XR) 500 MG 24 hr tablet, Take 1 tablet by mouth twice a day for weight loss., Disp: 180 tablet, Rfl: 0   Multiple Vitamin (MULTIVITAMIN) tablet, Take 1 tablet by mouth daily., Disp: , Rfl:    phentermine  (ADIPEX-P ) 37.5 MG tablet, Take 1 tablet (37.5 mg total) by mouth daily., Disp: 30 tablet, Rfl: 0   albuterol  (PROVENTIL  HFA) 108 (90 Base) MCG/ACT inhaler, Inhale 2 puffs every 4 hours by inhalation route as needed, for wheezing. (Patient not taking: Reported on 04/04/2024), Disp: 6.7 g, Rfl: 3   fluconazole  (DIFLUCAN ) 150 MG tablet, Take 1 tablet (150 mg total) by mouth then repeat dose in 72 hours. (Patient not taking: Reported on 04/04/2024), Disp: 2 tablet, Rfl: 0   fluticasone  (FLONASE  ALLERGY RELIEF) 50 MCG/ACT nasal spray, Place 2 sprays into each nostril daily (Patient not taking: Reported on 04/04/2024), Disp: 16 g, Rfl: 3   Lifitegrast  (XIIDRA ) 5 % SOLN, Instill 1 drop into both eyes twice a day (Patient not taking: Reported on 04/04/2024), Disp: 180 each, Rfl: 4   methocarbamol  (ROBAXIN ) 500 MG tablet, Take 1 tablet by mouth 3 (three) times daily as needed. (Patient not taking: Reported on 04/04/2024), Disp: , Rfl:  Past Medical History:  Diagnosis Date   Abnormal reflex    Alopecia, unspecified    Migraine, unspecified, without mention of intractable migraine without mention of status migrainosus    Obesity, unspecified    Personal history of other genital system and obstetric disorders(V13.29)    Recurrent headache 07/22/2013   Transient hypertension of pregnancy, unspecified as to episode of care    Unspecified vitamin D deficiency    Past Surgical History:  Procedure Laterality Date   DILATION AND CURETTAGE OF UTERUS  2014   Family History  Problem Relation Age of Onset   Hypertension Mother    Social History   Socioeconomic History   Marital status: Married    Spouse name: Not on file   Number of children: 4   Years of education:  college   Highest education level: Not on file  Occupational History    Employer: UNEMPLOYED  Tobacco Use   Smoking status: Never   Smokeless tobacco: Never  Substance and Sexual Activity   Alcohol use: No   Drug use: No   Sexual activity: Not on file  Other Topics Concern   Not on file  Social History Narrative   Patient lives at home with her family...and drinks tea.   Social Drivers of Corporate Investment Banker Strain: Not on file  Food Insecurity: Not on file  Transportation Needs: Not on file  Physical Activity: Not on file  Stress: Not on file  Social Connections: Not on file  Intimate Partner Violence: Unknown (08/05/2021)   Received from [facility]   HITS    Physically Hurt: Not on file    Insult or Talk Down To: Not on file    Threaten Physical Harm: Not on file    Scream or  Curse: Not on file       Objective:  BP 127/80   Pulse 90   Temp 98 F (36.7 C)   Ht 5' 5 (1.651 m) Comment: Per pt  Wt 184 lb (83.5 kg)   SpO2 98% Comment: RA  BMI 30.62 kg/m  BMI Readings from Last 3 Encounters:  04/04/24 30.62 kg/m  09/06/23 32.62 kg/m  10/13/22 30.62 kg/m    Physical Exam: Physical Exam   ENT: Normal mucosa.  PULMONARY: Lungs clear to auscultation bilaterally, no adventitious breath sounds. CARDIOVASCULAR: Regular rate and rhythm, S1 S2 normal, no murmurs. ABDOMEN: Abdomen soft, nontender. Bowel sounds are normal. EXTREMITIES: No peripheral edema noted.       Diagnostic Review:  Last metabolic panel Lab Results  Component Value Date   GLUCOSE 84 09/30/2022   NA 134 (L) 09/30/2022   K 3.5 09/30/2022   CL 100 09/30/2022   CO2 25 09/30/2022   BUN 11 09/30/2022   CREATININE 0.93 09/30/2022   GFRNONAA >60 09/30/2022   CALCIUM 9.3 09/30/2022   PROT 7.6 10/29/2019   ALBUMIN 3.7 10/29/2019   BILITOT 0.5 10/29/2019   ALKPHOS 64 10/29/2019   AST 20 10/29/2019   ALT 14 10/29/2019   ANIONGAP 9 09/30/2022         Assessment & Plan:    Assessment & Plan Mild intermittent asthma without complication Unclear if has asthma.  Orders:   Pulmonary Function Test; Future   Ambulatory referral to ENT  Restrictive lung disease        Assessment and Plan    Suspected restrictive lung disease Spirometry suggests restrictive lung disease. Differential includes effort-dependent abnormality or true restrictive lung disease. Chest X-ray shows possible hyperinflation. - Ordered full pulmonary function test (PFT).  Mild intermittent asthma Intermittent whistling sound possibly related to asthma. Symptoms resolve spontaneously. Family history of asthma and possible hyperinflation on chest X-ray considered. - Switched to symbicort prn.  - Scheduled follow-up in three months to review ENT evaluation and PFT results.      Notes from PCP dr Roanna 01/24/24 reviewed as to gather relevant information for patient care and formulating plan.  He/She was counselled about not driving while drowsy which is common side effect of sleep related disorders.   Return in about 3 months (around 07/03/2024).   I personally spent a total of 30 minutes in the care of the patient today including preparing to see the patient, getting/reviewing separately obtained history, performing a medically appropriate exam/evaluation, counseling and educating, placing orders, documenting clinical information in the EHR, independently interpreting results, and communicating results.   Yoshito Gaza, MD

## 2024-04-04 NOTE — Patient Instructions (Addendum)
  VISIT SUMMARY: Today, you were seen for difficulty breathing and a whistling sound in your chest. We discussed your symptoms, family history, and work history. Based on your spirometry results, we suspect restrictive lung disease and mild intermittent asthma.  YOUR PLAN: -SUSPECTED RESTRICTIVE LUNG DISEASE: Restrictive lung disease means your lungs may not fully expand, making it harder to breathe. We have ordered a full pulmonary function test to get more information and will refer you to an ENT specialist to evaluate the whistling sound in your chest.  -MILD INTERMITTENT ASTHMA: Asthma is a condition where your airways can narrow and swell, causing breathing difficulties. We believe your whistling sound may be related to mild asthma. We have switched you to a steroid-containing inhaler (synbicort) to help manage your symptoms. Stop albuterol  inh. We will follow up in three months to review the results of your ENT evaluation and pulmonary function test.  INSTRUCTIONS: Please complete the full pulmonary function test as ordered. We will also consider referring you to an ENT specialist for further evaluation of the whistling sound in your chest. Follow up in three months to review the results of these tests and your response to the new inhaler.                      Contains text generated by Abridge.                                 Contains text generated by Abridge.

## 2024-04-10 ENCOUNTER — Other Ambulatory Visit (HOSPITAL_COMMUNITY): Payer: Self-pay

## 2024-04-10 DIAGNOSIS — E669 Obesity, unspecified: Secondary | ICD-10-CM | POA: Diagnosis not present

## 2024-04-10 DIAGNOSIS — R030 Elevated blood-pressure reading, without diagnosis of hypertension: Secondary | ICD-10-CM | POA: Diagnosis not present

## 2024-04-10 DIAGNOSIS — J989 Respiratory disorder, unspecified: Secondary | ICD-10-CM | POA: Diagnosis not present

## 2024-04-10 DIAGNOSIS — Z6831 Body mass index (BMI) 31.0-31.9, adult: Secondary | ICD-10-CM | POA: Diagnosis not present

## 2024-04-10 MED ORDER — PHENTERMINE HCL 37.5 MG PO TABS
37.5000 mg | ORAL_TABLET | Freq: Every day | ORAL | 1 refills | Status: AC
  Filled 2024-04-10: qty 30, 30d supply, fill #0

## 2024-04-18 DIAGNOSIS — E785 Hyperlipidemia, unspecified: Secondary | ICD-10-CM | POA: Diagnosis not present

## 2024-06-21 ENCOUNTER — Institutional Professional Consult (permissible substitution) (INDEPENDENT_AMBULATORY_CARE_PROVIDER_SITE_OTHER)

## 2024-06-25 ENCOUNTER — Encounter (HOSPITAL_BASED_OUTPATIENT_CLINIC_OR_DEPARTMENT_OTHER)

## 2024-07-02 ENCOUNTER — Ambulatory Visit
